# Patient Record
Sex: Male | Born: 2000 | Race: White | Hispanic: No | Marital: Single | Smoking: Never smoker
Health system: Southern US, Community
[De-identification: ages and names within clinical notes are randomized; demographics above are authoritative.]

## PROBLEM LIST (undated history)

## (undated) DIAGNOSIS — J45909 Unspecified asthma, uncomplicated: Secondary | ICD-10-CM

## (undated) HISTORY — PX: JOINT REPLACEMENT: SHX530

---
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
## 2019-04-02 ENCOUNTER — Other Ambulatory Visit: Payer: Self-pay

## 2019-04-02 ENCOUNTER — Ambulatory Visit
Admission: EM | Admit: 2019-04-02 | Discharge: 2019-04-02 | Disposition: A | Discharge status: Home / Self Care | Payer: Medicaid Other | Attending: Emergency Medicine | Admitting: Emergency Medicine

## 2019-04-02 ENCOUNTER — Encounter: Payer: Self-pay | Admitting: *Deleted

## 2019-04-02 ENCOUNTER — Ambulatory Visit (INDEPENDENT_AMBULATORY_CARE_PROVIDER_SITE_OTHER): Payer: Medicaid Other

## 2019-04-02 DIAGNOSIS — R3916 Straining to void: Secondary | ICD-10-CM | POA: Diagnosis not present

## 2019-04-02 DIAGNOSIS — R109 Unspecified abdominal pain: Secondary | ICD-10-CM

## 2019-04-02 DIAGNOSIS — R195 Other fecal abnormalities: Secondary | ICD-10-CM | POA: Diagnosis not present

## 2019-04-02 DIAGNOSIS — K625 Hemorrhage of anus and rectum: Secondary | ICD-10-CM | POA: Diagnosis not present

## 2019-04-02 DIAGNOSIS — R110 Nausea: Secondary | ICD-10-CM | POA: Diagnosis not present

## 2019-04-02 LAB — POCT URINALYSIS DIP (MANUAL ENTRY)
Bilirubin, UA: NEGATIVE
Blood, UA: NEGATIVE
Glucose, UA: NEGATIVE mg/dL
Ketones, POC UA: NEGATIVE mg/dL
Leukocytes, UA: NEGATIVE
Nitrite, UA: NEGATIVE
Protein Ur, POC: NEGATIVE mg/dL
Spec Grav, UA: 1.015 (ref 1.010–1.025)
Urobilinogen, UA: 0.2 E.U./dL
pH, UA: 7.5 (ref 5.0–8.0)

## 2019-04-02 NOTE — ED Provider Notes (Signed)
[facility]   [id number] 04/02/19 Arrival Time: 1333  CC: Rectal bleeding, dark stools, and straining with urination  SUBJECTIVE:  Stephen GlazierJason T Meyer is a 18 y.o. male who presents with complaint of rectal bleeding with wiping that occurred 1 week ago.  Symptoms began after having a normal bowel movement.  Denies straining or hard stool.  Had black tarry stools this morning.  Denies taking pepto, ibuprofen, aleve, or alcohol use.  Denies rectal or abdominal pain.  Has not tried OTC medications.  Denies alleviating or aggravating factors.  Reports similar symptoms in the past with possible stomach ulcer 1 year ago.  Was seen at Urgent Care for that problem, does not have a PCP.  Last BM this morning dark tarry stool.  Complains of fluctuating appetite, nausea, diarrhea, as well as intermittent straining with urination x 1 month.    Denies fever, chills, weight changes, vomiting, chest pain, SOB, constipation, dysuria, difficulty urinating, increased frequency or urgency, flank pain, hematuria, loss of bowel or bladder function, testicular pain/ swelling, penile discharge.   ROS: As per HPI.  All other pertinent ROS negative.     Biological father died from prostate cancer in his 2650's.  Denies hx of inflammatory bowel disease or other cancer in the family.  Patient lives with adoptive parents.    History reviewed. No pertinent past medical history. History reviewed. No pertinent surgical history. No Known Allergies No current facility-administered medications on file prior to encounter.    Current Outpatient Medications on File Prior to Encounter  Medication Sig Dispense Refill  . ALBUTEROL SULFATE HFA IN Inhale 2 puffs into the lungs every 4 (four) hours as needed.     Social History   Socioeconomic History  . Marital status: Single    Spouse name: Not on file  . Number of children: Not on file  . Years of education: Not on file  . Highest education level: Not on file   Occupational History  . Not on file  Social Needs  . Financial resource strain: Not on file  . Food insecurity    Worry: Not on file    Inability: Not on file  . Transportation needs    Medical: Not on file    Non-medical: Not on file  Tobacco Use  . Smoking status: Never Smoker  . Smokeless tobacco: Never Used  Substance and Sexual Activity  . Alcohol use: Yes    Comment: occasionally  . Drug use: Yes  . Sexual activity: Yes    Birth control/protection: Condom  Lifestyle  . Physical activity    Days per week: Not on file    Minutes per session: Not on file  . Stress: Not on file  Relationships  . Social Musicianconnections    Talks on phone: Not on file    Gets together: Not on file    Attends religious service: Not on file    Active member of club or organization: Not on file    Attends meetings of clubs or organizations: Not on file    Relationship status: Not on file  . Intimate partner violence    Fear of current or ex partner: Not on file    Emotionally abused: Not on file    Physically abused: Not on file    Forced sexual activity: Not on file  Other Topics Concern  . Not on file  Social History Narrative  . Not on file   Family History  Adopted: Yes  Problem Relation  Age of Onset  . Drug abuse Mother   . Drug abuse Father   . Prostate cancer Father   . Diabetes Maternal Grandmother   . Diabetes Paternal Grandmother      OBJECTIVE:  Vitals:   04/02/19 1344 04/02/19 1345  BP: (!) 161/82   Pulse: (!) 57   Resp:  16  Temp:  98.1 F (36.7 C)  TempSrc:  Oral  SpO2: 98%     General appearance: Alert; NAD HEENT: NCAT. PERRL, EOMI grossly; Oropharynx clear.  Lungs: clear to auscultation bilaterally without adventitious breath sounds Heart: regular rate and rhythm.  Radial pulses 2+ symmetrical bilaterally Abdomen: soft, non-distended; normal active bowel sounds; mildly TTP over RLQ; negative Murphy's sign; negative rebound; no guarding RECTAL: Declines  Back: no CVA tenderness Extremities: no edema; symmetrical with no gross deformities Skin: warm and dry Neurologic: normal gait Psychological: alert and cooperative; normal mood and affect  LABS: Results for orders placed or performed during the hospital encounter of 04/02/19 (from the past 24 hour(s))  POCT urinalysis dipstick     Status: None   Collection Time: 04/02/19  2:00 PM  Result Value Ref Range   Color, UA yellow yellow   Clarity, UA clear clear   Glucose, UA negative negative mg/dL   Bilirubin, UA negative negative   Ketones, POC UA negative negative mg/dL   Spec Grav, UA 1.015 1.010 - 1.025   Blood, UA negative negative   pH, UA 7.5 5.0 - 8.0   Protein Ur, POC negative negative mg/dL   Urobilinogen, UA 0.2 0.2 or 1.0 E.U./dL   Nitrite, UA Negative Negative   Leukocytes, UA Negative Negative    DIAGNOSTIC STUDIES: Dg Abd 2 Views  Result Date: 04/02/2019 CLINICAL DATA:  Abdominal pain EXAM: ABDOMEN - 2 VIEW COMPARISON:  None. FINDINGS: Nonspecific bowel gas pattern. Mild volume of stool in the colon. No radiodense calculi. No gross free intraperitoneal air. Lung bases clear. Osseous structures unremarkable. IMPRESSION: Nonspecific bowel gas pattern. Electronically Signed   By: Davina Poke M.D.   On: 04/02/2019 14:58    ABD X-rays:  No obvious small or large bowel obstruction or stair-stepping pattern.  No obvious masses.     I have reviewed the x-rays myself and the radiologist interpretation. I am in agreement with the radiologist interpretation.     ASSESSMENT & PLAN:  1. Rectal bleeding   2. Dark stools   3. Straining on urination    Declines rectal exam in office Declines further work-up in the ED for RLQ pain and other symptoms Urine did not show signs of infection Abdominal x-ray showed some stool burden, but no perforations, or blockages Symptoms are concerning you should follow up with primary care as soon as possible.  [facility] can see  you next week.   In the meantime push fluids, drink at least half your body weight in ounces Eat a well-balanced diet of lean meats, fruits, and vegetables Return or go to the ED if you have any new or worsening symptoms such as fever, chills, nausea, vomiting, lightheadedness, dizziness, abdominal pain, other changes in bowel or urinary habits, etc...  Reviewed expectations re: course of current medical issues. Questions answered. Outlined signs and symptoms indicating need for more acute intervention. Patient verbalized understanding. After Visit Summary given.   Lestine Box, PA-C 04/02/19 1537

## 2019-04-02 NOTE — Discharge Instructions (Addendum)
Declines rectal exam in office Declines further work-up in the ED for RLQ pain and other symptoms Urine did not show signs of infection Abdominal x-ray showed some stool burden, but no perforations, or blockages Symptoms are concerning you should follow up with primary care as soon as possible.  [facility] can see you next week.   In the meantime push fluids, drink at least half your body weight in ounces Eat a well-balanced diet of lean meats, fruits, and vegetables Return or go to the ED if you have any new or worsening symptoms such as fever, chills, nausea, vomiting, lightheadedness, dizziness, abdominal pain, other changes in bowel or urinary habits, etc..Marland Kitchen

## 2019-04-02 NOTE — ED Triage Notes (Signed)
C/O noticing red blood in toilet following a BM approx 1 wk ago; states was isolated incident.  This AM noticed black tarry stool.  Also reports intermittent episodes of need to strain to urinate over past month.

## 2019-04-06 ENCOUNTER — Other Ambulatory Visit: Payer: Self-pay

## 2019-04-06 ENCOUNTER — Other Ambulatory Visit: Payer: Medicaid Other

## 2019-04-06 DIAGNOSIS — Z20822 Contact with and (suspected) exposure to covid-19: Secondary | ICD-10-CM

## 2019-04-06 DIAGNOSIS — R6889 Other general symptoms and signs: Secondary | ICD-10-CM | POA: Diagnosis not present

## 2019-04-08 LAB — NOVEL CORONAVIRUS, NAA: SARS-CoV-2, NAA: NOT DETECTED

## 2019-04-09 ENCOUNTER — Ambulatory Visit (INDEPENDENT_AMBULATORY_CARE_PROVIDER_SITE_OTHER): Payer: Medicaid Other | Admitting: Physician Assistant

## 2019-04-09 ENCOUNTER — Other Ambulatory Visit: Payer: Self-pay

## 2019-04-09 ENCOUNTER — Encounter: Payer: Self-pay | Admitting: Physician Assistant

## 2019-04-09 VITALS — BP 122/72 | HR 53 | Temp 98.9°F | Ht 72.0 in | Wt 184.0 lb

## 2019-04-09 DIAGNOSIS — K625 Hemorrhage of anus and rectum: Secondary | ICD-10-CM | POA: Diagnosis not present

## 2019-04-09 DIAGNOSIS — R34 Anuria and oliguria: Secondary | ICD-10-CM | POA: Diagnosis not present

## 2019-04-09 NOTE — Patient Instructions (Signed)
Constipation, Adult Constipation is when a person:  Poops (has a bowel movement) fewer times in a week than normal.  Has a hard time pooping.  Has poop that is dry, hard, or bigger than normal. Follow these instructions at home: Eating and drinking   Eat foods that have a lot of fiber, such as: ? Fresh fruits and vegetables. ? Whole grains. ? Beans.  Eat less of foods that are high in fat, low in fiber, or overly processed, such as: ? [place] fries. ? Hamburgers. ? Cookies. ? Candy. ? Soda.  Drink enough fluid to keep your pee (urine) clear or pale yellow. General instructions  Exercise regularly or as told by your doctor.  Go to the restroom when you feel like you need to poop. Do not hold it in.  Take over-the-counter and prescription medicines only as told by your doctor. These include any fiber supplements.  Do pelvic floor retraining exercises, such as: ? Doing deep breathing while relaxing your lower belly (abdomen). ? Relaxing your pelvic floor while pooping.  Watch your condition for any changes.  Keep all follow-up visits as told by your doctor. This is important. Contact a doctor if:  You have pain that gets worse.  You have a fever.  You have not pooped for 4 days.  You throw up (vomit).  You are not hungry.  You lose weight.  You are bleeding from the anus.  You have thin, pencil-like poop (stool). Get help right away if:  You have a fever, and your symptoms suddenly get worse.  You leak poop or have blood in your poop.  Your belly feels hard or bigger than normal (is bloated).  You have very bad belly pain.  You feel dizzy or you faint. This information is not intended to replace advice given to you by your health care provider. Make sure you discuss any questions you have with your health care provider. Document Released: 02/13/2008 Document Revised: 08/09/2017 Document Reviewed: 02/15/2016 Elsevier Patient Education  2020 [employer] Hemorrhoids Hemorrhoids are swollen veins in and around the rectum or anus. There are two types of hemorrhoids:  Internal hemorrhoids. These occur in the veins that are just inside the rectum. They may poke through to the outside and become irritated and painful.  External hemorrhoids. These occur in the veins that are outside the anus and can be felt as a painful swelling or hard lump near the anus. Most hemorrhoids do not cause serious problems, and they can be managed with home treatments such as diet and lifestyle changes. If home treatments do not help the symptoms, procedures can be done to shrink or remove the hemorrhoids. What are the causes? This condition is caused by increased pressure in the anal area. This pressure may result from various things, including:  Constipation.  Straining to have a bowel movement.  Diarrhea.  Pregnancy.  Obesity.  Sitting for long periods of time.  Heavy lifting or other activity that causes you to strain.  Anal sex.  Riding a bike for a long period of time. What are the signs or symptoms? Symptoms of this condition include:  Pain.  Anal itching or irritation.  Rectal bleeding.  Leakage of stool (feces).  Anal swelling.  One or more lumps around the anus. How is this diagnosed? This condition can often be diagnosed through a visual exam. Other exams or tests may also be done, such as:  An exam that involves feeling the rectal area with a  gloved hand (digital rectal exam).  An exam of the anal canal that is done using a small tube (anoscope).  A blood test, if you have lost a significant amount of blood.  A test to look inside the colon using a flexible tube with a camera on the end (sigmoidoscopy or colonoscopy). How is this treated? This condition can usually be treated at home. However, various procedures may be done if dietary changes, lifestyle changes, and other home treatments do not help your symptoms. These  procedures can help make the hemorrhoids smaller or remove them completely. Some of these procedures involve surgery, and others do not. Common procedures include:  Rubber band ligation. Rubber bands are placed at the base of the hemorrhoids to cut off their blood supply.  Sclerotherapy. Medicine is injected into the hemorrhoids to shrink them.  Infrared coagulation. A type of light energy is used to get rid of the hemorrhoids.  Hemorrhoidectomy surgery. The hemorrhoids are surgically removed, and the veins that supply them are tied off.  Stapled hemorrhoidopexy surgery. The surgeon staples the base of the hemorrhoid to the rectal wall. Follow these instructions at home: Eating and drinking   Eat foods that have a lot of fiber in them, such as whole grains, beans, nuts, fruits, and vegetables.  Ask your health care provider about taking products that have added fiber (fiber supplements).  Reduce the amount of fat in your diet. You can do this by eating low-fat dairy products, eating less red meat, and avoiding processed foods.  Drink enough fluid to keep your urine pale yellow. Managing pain and swelling   Take warm sitz baths for 20 minutes, 3-4 times a day to ease pain and discomfort. You may do this in a bathtub or using a portable sitz bath that fits over the toilet.  If directed, apply ice to the affected area. Using ice packs between sitz baths may be helpful. ? Put ice in a plastic bag. ? Place a towel between your skin and the bag. ? Leave the ice on for 20 minutes, 2-3 times a day. General instructions  Take over-the-counter and prescription medicines only as told by your health care provider.  Use medicated creams or suppositories as told.  Get regular exercise. Ask your health care provider how much and what kind of exercise is best for you. In general, you should do moderate exercise for at least 30 minutes on most days of the week (150 minutes each week). This can  include activities such as walking, biking, or yoga.  Go to the bathroom when you have the urge to have a bowel movement. Do not wait.  Avoid straining to have bowel movements.  Keep the anal area dry and clean. Use wet toilet paper or moist towelettes after a bowel movement.  Do not sit on the toilet for long periods of time. This increases blood pooling and pain.  Keep all follow-up visits as told by your health care provider. This is important. Contact a health care provider if you have:  Increasing pain and swelling that are not controlled by treatment or medicine.  Difficulty having a bowel movement, or you are unable to have a bowel movement.  Pain or inflammation outside the area of the hemorrhoids. Get help right away if you have:  Uncontrolled bleeding from your rectum. Summary  Hemorrhoids are swollen veins in and around the rectum or anus.  Most hemorrhoids can be managed with home treatments such as diet and lifestyle changes.  Taking warm sitz baths can help ease pain and discomfort.  In severe cases, procedures or surgery can be done to shrink or remove the hemorrhoids. This information is not intended to replace advice given to you by your health care provider. Make sure you discuss any questions you have with your health care provider. Document Released: 08/24/2000 Document Revised: 09/04/2018 Document Reviewed: 01/16/2018 Elsevier Patient Education  2020 [employer].

## 2019-04-09 NOTE — Progress Notes (Signed)
  Subjective:     Patient ID: Stephen Meyer, male   DOB: 09/01/2001, 18 y.o.   MRN: [id number]  HPI Pt here for f/u from Urgent Care Recently seen due to rectal bleeding and urinary hesitancy States sx have improved At the time rectal bleeding was just with BM and was scant amt and bright red Had some assoc abd cramping and urinary sx Xray showed some constipation  Review of Systems  Gastrointestinal: Positive for abdominal pain, anal bleeding, blood in stool and constipation. Negative for abdominal distention, diarrhea, nausea, rectal pain and vomiting.  Genitourinary: Positive for decreased urine volume and difficulty urinating. Negative for discharge, dysuria, flank pain, frequency, hematuria, scrotal swelling, testicular pain and urgency.       Objective:   Physical Exam Vitals signs (BP improved today) and nursing note reviewed.  Constitutional:      Appearance: Normal appearance. He is normal weight. He is not ill-appearing.  Abdominal:     General: Abdomen is flat. Bowel sounds are normal. There is no distension.     Palpations: Abdomen is soft. There is no mass.     Tenderness: There is no abdominal tenderness. There is no guarding or rebound.  Neurological:     Mental Status: He is alert.  Psychiatric:        Mood and Affect: Mood normal.        Behavior: Behavior normal.        Thought Content: Thought content normal.        Judgment: Judgment normal.        Assessment:     1. Urine production scanty   2. Rectal bleeding        Plan:     Discussed rectal sx seem SE of constipation/hemorrhoids Importance of fiber and fluids discussed Pt is also sexual active and not using condoms at all times Due to urinary sx urine GC/Chla done today and will inform of results Safe sex practices reviewed OTC meds for hemorrhoids if sx reoccur F/U pending labs

## 2019-04-14 DIAGNOSIS — Z029 Encounter for administrative examinations, unspecified: Secondary | ICD-10-CM

## 2019-04-16 LAB — CHLAMYDIA/GONOCOCCUS/TRICHOMONAS, NAA
Chlamydia by NAA: NEGATIVE
Gonococcus by NAA: NEGATIVE
Trich vag by NAA: NEGATIVE

## 2019-04-21 ENCOUNTER — Telehealth: Payer: Self-pay | Admitting: Family Medicine

## 2019-04-22 NOTE — Telephone Encounter (Signed)
Pt aware FMLA ppw faxed on 04/20/19

## 2019-05-25 ENCOUNTER — Encounter: Payer: Self-pay | Admitting: *Deleted

## 2019-10-05 ENCOUNTER — Other Ambulatory Visit: Payer: Self-pay

## 2019-10-05 ENCOUNTER — Encounter: Payer: Self-pay | Admitting: Emergency Medicine

## 2019-10-05 ENCOUNTER — Ambulatory Visit
Admission: EM | Admit: 2019-10-05 | Discharge: 2019-10-05 | Disposition: A | Discharge status: Home / Self Care | Payer: Medicaid Other | Attending: Emergency Medicine | Admitting: Emergency Medicine

## 2019-10-05 DIAGNOSIS — B070 Plantar wart: Secondary | ICD-10-CM

## 2019-10-05 MED ORDER — SALICYLIC ACID 27.5 % EX LIQD: 1.0000 | Freq: Two times a day (BID) | CUTANEOUS | 0 refills | Status: DC

## 2019-10-05 NOTE — Discharge Instructions (Addendum)
Apply medication as prescribed Soak foot in warm water Remove the top layer of skin before applying medicine. May use  pumice stone to remove the skin Follow-up with primary care for referral to a podiatry

## 2019-10-05 NOTE — ED Provider Notes (Signed)
[facility]    CSN: [id number] Arrival date & time: 10/05/19  1140      History   Chief Complaint Chief Complaint  Patient presents with  . Foot Pain    HPI Stephen Meyer is a 19 y.o. male.   Stephen Meyer 34 y old male with history of wart complains of bilateral  plantar wart on bilateral distal dorsal feet and great toe for the past 2 to 3 weeks.  Reports history of foot wart.  He localizes the pain to the sole of both feet.  He describes the pain as constant and achy.  He has not tried any medication.  His symptoms are made worse with ROM.  He denies similar symptoms in the past that improved after visiting a podiatrist       History reviewed. No pertinent past medical history.  Patient Active Problem List   Diagnosis Date Noted  . 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 Obesity, unspecified 01/21/2013  . Exercise-induced asthma 01/21/2013  . Foster care (status) 01/21/2013    Past Surgical History:  Procedure Laterality Date  . JOINT REPLACEMENT     ankle sugery       Home Medications    Prior to Admission medications   Medication Sig Start Date End Date Taking? Authorizing Provider  Salicylic Acid 40.9 % LIQD Apply 1 each topically 2 (two) times daily. 10/05/19 11/04/19  AvegnoDarrelyn Hillock, FNP    Family History Family History  Adopted: Yes  Problem Relation Age of Onset  . Prostate cancer Father   . Cancer Father   . Diabetes Maternal Grandmother   . Diabetes Paternal Grandmother     Social History Social History   Tobacco Use  . Smoking status: Never Smoker  . Smokeless tobacco: Never Used  Substance Use Topics  . Alcohol use: Yes    Comment: occasionally  . Drug use: Yes    Types: Marijuana     Allergies   Patient has no known allergies.   Review of Systems Review of Systems  Constitutional: Negative.   Respiratory: Negative.   Cardiovascular: Negative.   Skin:       Plantar wart  All other systems reviewed and are  negative.    Physical Exam Triage Vital Signs ED Triage Vitals  Enc Vitals Group     BP      Pulse      Resp      Temp      Temp src      SpO2      Weight      Height      Head Circumference      Peak Flow      Pain Score      Pain Loc      Pain Edu?      Excl. in [place]?    No data found.  Updated Vital Signs BP 126/70 (BP Location: Right Arm)   Pulse (!) 53   Temp 98.2 F (36.8 C) (Oral)   Resp 16   SpO2 98%   Visual Acuity Right Eye Distance:   Left Eye Distance:   Bilateral Distance:    Right Eye Near:   Left Eye Near:    Bilateral Near:     Physical Exam Vitals and nursing note reviewed.  Constitutional:      General: He is not in acute distress.    Appearance: Normal appearance. He is normal weight. He is not ill-appearing or toxic-appearing.  Cardiovascular:     Rate and Rhythm: Normal rate and regular rhythm.     Pulses: Normal pulses.     Heart sounds: No murmur.  Pulmonary:     Effort: Pulmonary effort is normal. No respiratory distress.     Breath sounds: Normal breath sounds. No stridor. No wheezing, rhonchi or rales.  Chest:     Chest wall: No tenderness.  Skin:    General: Skin is warm.     Coloration: Skin is not jaundiced.     Findings: No abrasion, abscess, bruising, erythema, signs of injury or lesion. Rash is not crusting, nodular, purpuric or pustular.     Comments: Plantar wart present both feet  Neurological:     Mental Status: He is alert.      UC Treatments / Results  Labs (all labs ordered are listed, but only abnormal results are displayed) Labs Reviewed - No data to display  EKG   Radiology No results found.  Procedures Procedures (including critical care time)  Medications Ordered in UC Medications - No data to display  Initial Impression / Assessment and Plan / UC Course  I have reviewed the triage vital signs and the nursing notes.  Pertinent labs & imaging results that were available during my care of  the patient were reviewed by me and considered in my medical decision making (see chart for details).   Advised patient to follow-up with primary care for referral to podiatry Acid salicylic was prescribed Advised patient to return for worsening symptom  Final Clinical Impressions(s) / UC Diagnoses   Final diagnoses:  Plantar wart of both feet     Discharge Instructions     Apply medication as prescribed Soak foot in warm water Remove the top layer of soft and skin before applying medicine for can use pumice stone to remove the skin Follow-up with primary care for referral to a podiatry    ED Prescriptions    Medication Sig Dispense Auth. Provider   Salicylic Acid 27.5 % LIQD Apply 1 each topically 2 (two) times daily. 20 mL Terrilee Dudzik, Zachery Dakins, FNP     PDMP not reviewed this encounter.   Durward Parcel, FNP 10/05/19 1224

## 2019-10-05 NOTE — ED Triage Notes (Signed)
Pt concerned that he has warts on soles of both feet

## 2019-10-08 ENCOUNTER — Ambulatory Visit
Admission: EM | Admit: 2019-10-08 | Discharge: 2019-10-08 | Disposition: A | Discharge status: Home / Self Care | Payer: Medicaid Other | Attending: Emergency Medicine | Admitting: Emergency Medicine

## 2019-10-08 ENCOUNTER — Other Ambulatory Visit: Payer: Self-pay

## 2019-10-08 DIAGNOSIS — Z711 Person with feared health complaint in whom no diagnosis is made: Secondary | ICD-10-CM | POA: Diagnosis not present

## 2019-10-08 DIAGNOSIS — Z20822 Contact with and (suspected) exposure to covid-19: Secondary | ICD-10-CM

## 2019-10-08 NOTE — Discharge Instructions (Addendum)
COVID testing ordered.  It will take between 2-5 days for test results.  Someone will contact you regarding abnormal results.    In the meantime: You should remain isolated in your home for 10 days from symptom onset AND greater than 72 hours after symptoms resolution (absence of fever without the use of fever-reducing medication and improvement in respiratory symptoms), whichever is longer Get plenty of rest and push fluids Use OTC zyrtec for nasal congestion, runny nose, and/or sore throat Use OTC flonase for nasal congestion and runny nose Use medications daily for symptom relief Use OTC medications like ibuprofen or tylenol as needed fever or pain Call or go to the ED if you have any new or worsening symptoms such as fever, cough, shortness of breath, chest tightness, chest pain, turning blue, changes in mental status, etc...  

## 2019-10-08 NOTE — ED Provider Notes (Signed)
[facility]   [id number] 10/08/19 Arrival Time: 0254   CC: Loss of taste  SUBJECTIVE: History from: patient.  Stephen Meyer is a 19 y.o. male who presents with loss of taste that occurred briefly yesterday, that later resolved.  Denies sick exposure to COVID, flu or strep.  Denies recent travel.  Denies aggravating or alleviating factors.  Denies previous COVID infection.   Denies fever, chills, fatigue, nasal congestion, rhinorrhea, sore throat, cough, SOB, wheezing, chest pain, nausea, vomiting, changes in bowel or bladder habits.     ROS: As per HPI.  All other pertinent ROS negative.     History reviewed. No pertinent past medical history. Past Surgical History:  Procedure Laterality Date  . JOINT REPLACEMENT     ankle sugery   No Known Allergies No current facility-administered medications on file prior to encounter.   Current Outpatient Medications on File Prior to Encounter  Medication Sig Dispense Refill  . Salicylic Acid 27.0 % LIQD Apply 1 each topically 2 (two) times daily. 20 mL 0   Social History   Socioeconomic History  . Marital status: Single    Spouse name: Not on file  . Number of children: Not on file  . Years of education: Not on file  . Highest education level: Not on file  Occupational History  . Not on file  Tobacco Use  . Smoking status: Never Smoker  . Smokeless tobacco: Never Used  Substance and Sexual Activity  . Alcohol use: Yes    Comment: occasionally  . Drug use: Yes    Types: Marijuana  . Sexual activity: Yes    Birth control/protection: Condom  Other Topics Concern  . Not on file  Social History Narrative  . Not on file   Social Determinants of Health   Financial Resource Strain:   . Difficulty of Paying Living Expenses: Not on file  Food Insecurity:   . Worried About Charity fundraiser in the Last Year: Not on file  . Ran Out of Food in the Last Year: Not on file  Transportation Needs:   . Lack of  Transportation (Medical): Not on file  . Lack of Transportation (Non-Medical): Not on file  Physical Activity:   . Days of Exercise per Week: Not on file  . Minutes of Exercise per Session: Not on file  Stress:   . Feeling of Stress : Not on file  Social Connections:   . Frequency of Communication with Friends and Family: Not on file  . Frequency of Social Gatherings with Friends and Family: Not on file  . Attends Religious Services: Not on file  . Active Member of Clubs or Organizations: Not on file  . Attends Archivist Meetings: Not on file  . Marital Status: Not on file  Intimate Partner Violence:   . Fear of Current or Ex-Partner: Not on file  . Emotionally Abused: Not on file  . Physically Abused: Not on file  . Sexually Abused: Not on file   Family History  Adopted: Yes  Problem Relation Age of Onset  . Prostate cancer Father   . Cancer Father   . Diabetes Maternal Grandmother   . Diabetes Paternal Grandmother     OBJECTIVE:  Vitals:   10/08/19 1352  BP: 135/75  Pulse: 61  Resp: 18  Temp: 98.7 F (37.1 C)  SpO2: 98%    General appearance: alert; well-appearing, nontoxic; speaking in full sentences and tolerating own secretions HEENT: NCAT; Ears: EACs  clear, TMs pearly gray; Eyes: PERRL.  EOM grossly intact. Nose: nares patent without rhinorrhea, Throat: oropharynx clear, tonsils non erythematous or enlarged, uvula midline  Neck: supple without LAD Lungs: unlabored respirations, symmetrical air entry; cough: absent; no respiratory distress; CTAB Heart: regular rate and rhythm.  Skin: warm and dry Psychological: alert and cooperative; normal mood and affect  ASSESSMENT & PLAN:  1. Encounter for laboratory testing for COVID-19 virus   2. Worried well     COVID testing ordered.  It will take between 2-5 days for test results.  Someone will contact you regarding abnormal results.    In the meantime: You should remain isolated in your home for 10  days from symptom onset AND greater than 72 hours after symptoms resolution (absence of fever without the use of fever-reducing medication and improvement in respiratory symptoms), whichever is longer Get plenty of rest and push fluids Use OTC zyrtec for nasal congestion, runny nose, and/or sore throat Use OTC flonase for nasal congestion and runny nose Use medications daily for symptom relief Use OTC medications like ibuprofen or tylenol as needed fever or pain Call or go to the ED if you have any new or worsening symptoms such as fever, cough, shortness of breath, chest tightness, chest pain, turning blue, changes in mental status, etc...   Reviewed expectations re: course of current medical issues. Questions answered. Outlined signs and symptoms indicating need for more acute intervention. Patient verbalized understanding. After Visit Summary given.         Rennis Harding, PA-C 10/08/19 1403

## 2019-10-08 NOTE — ED Triage Notes (Signed)
Pt needs covid test for work  

## 2019-10-09 LAB — NOVEL CORONAVIRUS, NAA: SARS-CoV-2, NAA: NOT DETECTED

## 2019-10-14 ENCOUNTER — Ambulatory Visit
Admission: EM | Admit: 2019-10-14 | Discharge: 2019-10-14 | Disposition: A | Discharge status: Home / Self Care | Payer: Medicaid Other

## 2019-10-14 ENCOUNTER — Other Ambulatory Visit: Payer: Self-pay

## 2019-10-27 ENCOUNTER — Ambulatory Visit (INDEPENDENT_AMBULATORY_CARE_PROVIDER_SITE_OTHER): Payer: Medicaid Other | Admitting: Family Medicine

## 2019-10-27 ENCOUNTER — Other Ambulatory Visit: Payer: Self-pay

## 2019-10-27 ENCOUNTER — Encounter: Payer: Self-pay | Admitting: Family Medicine

## 2019-10-27 VITALS — BP 121/71 | HR 65 | Temp 99.0°F | Resp 18 | Ht 72.06 in | Wt 181.0 lb

## 2019-10-27 DIAGNOSIS — R55 Syncope and collapse: Secondary | ICD-10-CM | POA: Diagnosis not present

## 2019-10-27 DIAGNOSIS — B078 Other viral warts: Secondary | ICD-10-CM

## 2019-10-27 DIAGNOSIS — R002 Palpitations: Secondary | ICD-10-CM | POA: Diagnosis not present

## 2019-10-27 DIAGNOSIS — R350 Frequency of micturition: Secondary | ICD-10-CM

## 2019-10-27 DIAGNOSIS — B079 Viral wart, unspecified: Secondary | ICD-10-CM

## 2019-10-27 LAB — BAYER DCA HB A1C WAIVED: HB A1C (BAYER DCA - WAIVED): 4.8 % (ref <7.0)

## 2019-10-27 LAB — GLUCOSE HEMOCUE WAIVED: Glu Hemocue Waived: 99 mg/dL (ref 65–99)

## 2019-10-27 NOTE — Progress Notes (Signed)
Subjective:  Patient ID: Stephen Meyer, male    DOB: 02-Oct-2000, 19 y.o.   MRN: [id number]  Patient Care Team: Baruch Gouty, FNP as PCP - General (Family Medicine)   Chief Complaint:  Fatigue (episodes of black out/ pass out )   HPI: Stephen Meyer is a 19 y.o. male presenting on 10/27/2019 for Fatigue (episodes of black out/ pass out )   Pt presents today for evaluation of polyuria, near syncope, palpitations, and syncope. Pt states this has been going on for over 2 years. States he has passed out a few times. States he feels hot and diaphoretic prior to passing out. States this happens at random and nothing seems to make this worse or better. No chest pain or shortness of breath. No focal deficits. No loss of bowel or bladder. No seizure like activity. Does report voiding more frequently but no dysuria or hematuria. No polydipsia or polyphagia. No weight changes. No hair loss or skin changes.      Relevant past medical, surgical, family, and social history reviewed and updated as indicated.  Allergies and medications reviewed and updated. Date reviewed: Chart in Epic.   History reviewed. No pertinent past medical history.  Past Surgical History:  Procedure Laterality Date  . JOINT REPLACEMENT     ankle sugery    Social History   Socioeconomic History  . Marital status: Single    Spouse name: Not on file  . Number of children: Not on file  . Years of education: Not on file  . Highest education level: Not on file  Occupational History  . Not on file  Tobacco Use  . Smoking status: Never Smoker  . Smokeless tobacco: Never Used  Substance and Sexual Activity  . Alcohol use: Yes    Comment: occasionally  . Drug use: Yes    Types: Marijuana  . Sexual activity: Yes    Birth control/protection: Condom  Other Topics Concern  . Not on file  Social History Narrative  . Not on file   Social Determinants of Health   Financial Resource Strain:   . Difficulty of  Paying Living Expenses: Not on file  Food Insecurity:   . Worried About Charity fundraiser in the Last Year: Not on file  . Ran Out of Food in the Last Year: Not on file  Transportation Needs:   . Lack of Transportation (Medical): Not on file  . Lack of Transportation (Non-Medical): Not on file  Physical Activity:   . Days of Exercise per Week: Not on file  . Minutes of Exercise per Session: Not on file  Stress:   . Feeling of Stress : Not on file  Social Connections:   . Frequency of Communication with Friends and Family: Not on file  . Frequency of Social Gatherings with Friends and Family: Not on file  . Attends Religious Services: Not on file  . Active Member of Clubs or Organizations: Not on file  . Attends Archivist Meetings: Not on file  . Marital Status: Not on file  Intimate Partner Violence:   . Fear of Current or Ex-Partner: Not on file  . Emotionally Abused: Not on file  . Physically Abused: Not on file  . Sexually Abused: Not on file    Outpatient Encounter Medications as of 10/27/2019  Medication Sig  . [DISCONTINUED] Salicylic Acid 55.7 % LIQD Apply 1 each topically 2 (two) times daily.   No facility-administered encounter medications  on file as of 10/27/2019.    No Known Allergies  Review of Systems  Constitutional: Negative for activity change, appetite change, chills, diaphoresis, fatigue, fever and unexpected weight change.  HENT: Negative.   Eyes: Negative.  Negative for photophobia and visual disturbance.  Respiratory: Negative for cough, chest tightness and shortness of breath.   Cardiovascular: Positive for palpitations. Negative for chest pain and leg swelling.  Gastrointestinal: Negative for abdominal pain, anal bleeding, blood in stool, constipation, diarrhea, nausea and vomiting.  Endocrine: Positive for polyuria. Negative for cold intolerance, heat intolerance, polydipsia and polyphagia.  Genitourinary: Negative for decreased urine  volume, difficulty urinating, dysuria, frequency and urgency.  Musculoskeletal: Negative for arthralgias and myalgias.  Skin:       Lesion to right great toe  Allergic/Immunologic: Negative.   Neurological: Positive for dizziness, syncope and light-headedness. Negative for tremors, seizures, facial asymmetry, speech difficulty, weakness, numbness and headaches.  Hematological: Negative.  Negative for adenopathy. Does not bruise/bleed easily.  Psychiatric/Behavioral: Negative for confusion, hallucinations, sleep disturbance and suicidal ideas.  All other systems reviewed and are negative.       Objective:  BP 121/71   Pulse 65   Temp 99 F (37.2 C)   Resp 18   Ht 6' 0.06" (1.83 m)   Wt 181 lb (82.1 kg)   SpO2 99%   BMI 24.51 kg/m    Wt Readings from Last 3 Encounters:  10/27/19 181 lb (82.1 kg) (82 %, Z= 0.91)*  04/09/19 184 lb (83.5 kg) (86 %, Z= 1.06)*  11/21/16 180 lb (81.6 kg) (91 %, Z= 1.33)*   * Growth percentiles are based on CDC (Boys, 2-20 Years) data.    Physical Exam Vitals and nursing note reviewed.  Constitutional:      General: He is not in acute distress.    Appearance: Normal appearance. He is well-developed and well-groomed. He is not ill-appearing, toxic-appearing or diaphoretic.  HENT:     Head: Normocephalic and atraumatic.     Jaw: There is normal jaw occlusion.     Right Ear: Hearing normal.     Left Ear: Hearing normal.     Nose: Nose normal.     Mouth/Throat:     Lips: Pink.     Mouth: Mucous membranes are moist.     Pharynx: Oropharynx is clear. Uvula midline.  Eyes:     General: Lids are normal.     Extraocular Movements: Extraocular movements intact.     Conjunctiva/sclera: Conjunctivae normal.     Pupils: Pupils are equal, round, and reactive to light.  Neck:     Thyroid: No thyroid mass, thyromegaly or thyroid tenderness.     Vascular: No carotid bruit or JVD.     Trachea: Trachea and phonation normal.  Cardiovascular:     Rate and  Rhythm: Regular rhythm. Bradycardia present.  No extrasystoles are present.    Chest Wall: PMI is not displaced.     Pulses: Normal pulses.     Heart sounds: Normal heart sounds. No murmur. No friction rub. No gallop. No S3 sounds.   Pulmonary:     Effort: Pulmonary effort is normal. No respiratory distress.     Breath sounds: Normal breath sounds. No wheezing.  Abdominal:     General: Bowel sounds are normal. There is no distension or abdominal bruit.     Palpations: Abdomen is soft. There is no hepatomegaly or splenomegaly.     Tenderness: There is no abdominal tenderness. There is no right  CVA tenderness or left CVA tenderness.     Hernia: No hernia is present.  Musculoskeletal:        General: Normal range of motion.     Cervical back: Normal range of motion and neck supple.     Right lower leg: No edema.     Left lower leg: No edema.  Lymphadenopathy:     Cervical: No cervical adenopathy.  Skin:    General: Skin is warm and dry.     Capillary Refill: Capillary refill takes less than 2 seconds.     Coloration: Skin is not cyanotic, jaundiced or pale.     Findings: Lesion present. No rash.     Comments: Hyperkeratotic lesion to right medial great toe  Neurological:     General: No focal deficit present.     Mental Status: He is alert and oriented to person, place, and time.     Cranial Nerves: Cranial nerves are intact. No cranial nerve deficit.     Sensory: Sensation is intact. No sensory deficit.     Motor: Motor function is intact. No weakness.     Coordination: Coordination is intact. Coordination normal.     Gait: Gait is intact. Gait normal.     Deep Tendon Reflexes: Reflexes are normal and symmetric. Reflexes normal.  Psychiatric:        Attention and Perception: Attention and perception normal.        Mood and Affect: Mood and affect normal.        Speech: Speech normal.        Behavior: Behavior normal. Behavior is cooperative.        Thought Content: Thought  content normal.        Cognition and Memory: Cognition and memory normal.        Judgment: Judgment normal.     Results for orders placed or performed during the hospital encounter of 10/08/19  Novel Coronavirus, NAA ([facility])   Specimen: Nasopharyngeal Swab; Nasopharyngeal(NP) swabs in vial transport medium   NASOPHARYNGE  Result Value Ref Range   SARS-CoV-2, NAA Not Detected Not Detected     Finger stick blood sugar 99 in office with A1C of 4.8. EKG: SB without ectopy or ST-T changes. No previous EKG for comparison. Monia Pouch, FNP-C  Cryo therapy to right great toe verruca vulgaris, three freeze thaw cycles completed. Tolerated well. Bandage applied after procedure completed.   Pertinent labs & imaging results that were available during my care of the patient were reviewed by me and considered in my medical decision making.  Assessment & Plan:  Tildon was seen today for fatigue.  Diagnoses and all orders for this visit:  Increased urinary frequency Finger stick 99 in office with A1C of 4.8. discussed potential causes in detail. Pt aware to eat small frequent meals daily and to keep a log of when symptoms occur.  -     Glucose Hemocue Waived -     Bayer DCA Hb A1c Waived -     CMP14+EGFR -     CBC with Differential/Platelet -     Thyroid Panel With TSH  Syncope, unspecified syncope type Palpitations EKG without acute changes in office. No chest pain or shortness of breath. Pt aware to keep a log of symptoms and surrounding factors. Labs pending. Will check for anemia, thyroid dysfunction, and electrolyte imbalances. Will refer to cardiology if symptoms persist.  -     Glucose Hemocue Waived -     Bayer [facility]  Hb A1c Waived -     CMP14+EGFR -     CBC with Differential/Platelet -     Thyroid Panel With TSH -     EKG 12-Lead  Verruca vulgaris Cryo therapy in office. Will return in 4 weeks for repeat therapy. Aftercare discussed in detail.     Continue all other  maintenance medications.  Follow up plan: Return in about 4 weeks (around 11/24/2019), or if symptoms worsen or fail to improve, for repeat Cryo.  Continue healthy lifestyle choices, including diet (rich in fruits, vegetables, and lean proteins, and low in salt and simple carbohydrates) and exercise (at least 30 minutes of moderate physical activity daily).  Educational handout given for cryo after care   The above assessment and management plan was discussed with the patient. The patient verbalized understanding of and has agreed to the management plan. Patient is aware to call the clinic if they develop any new symptoms or if symptoms persist or worsen. Patient is aware when to return to the clinic for a follow-up visit. Patient educated on when it is appropriate to go to the emergency department.   Monia Pouch, FNP-C [facility] [phone]

## 2019-10-27 NOTE — Progress Notes (Deleted)
Subjective:  Patient ID: Stephen Meyer, male    DOB: 2000/12/28, 19 y.o.   MRN: [id number]  Patient Care Team: Baruch Gouty, FNP as PCP - General (Family Medicine)   Chief Complaint:  Fatigue (episodes of black out/ pass out )   HPI: Stephen Meyer is a 19 y.o. male presenting on 10/27/2019 for Fatigue (episodes of black out/ pass out )   HPI 1. Increased urinary frequency ***  2. Syncope, unspecified syncope type ***  3. Palpitations ***  4. Verruca vulgaris ***     Relevant past medical, surgical, family, and social history reviewed and updated as indicated.  Allergies and medications reviewed and updated. Date reviewed: Chart in Epic.   History reviewed. No pertinent past medical history.  Past Surgical History:  Procedure Laterality Date  . JOINT REPLACEMENT     ankle sugery    Social History   Socioeconomic History  . Marital status: Single    Spouse name: Not on file  . Number of children: Not on file  . Years of education: Not on file  . Highest education level: Not on file  Occupational History  . Not on file  Tobacco Use  . Smoking status: Never Smoker  . Smokeless tobacco: Never Used  Substance and Sexual Activity  . Alcohol use: Yes    Comment: occasionally  . Drug use: Yes    Types: Marijuana  . Sexual activity: Yes    Birth control/protection: Condom  Other Topics Concern  . Not on file  Social History Narrative  . Not on file   Social Determinants of Health   Financial Resource Strain:   . Difficulty of Paying Living Expenses: Not on file  Food Insecurity:   . Worried About Charity fundraiser in the Last Year: Not on file  . Ran Out of Food in the Last Year: Not on file  Transportation Needs:   . Lack of Transportation (Medical): Not on file  . Lack of Transportation (Non-Medical): Not on file  Physical Activity:   . Days of Exercise per Week: Not on file  . Minutes of Exercise per Session: Not on file  Stress:     . Feeling of Stress : Not on file  Social Connections:   . Frequency of Communication with Friends and Family: Not on file  . Frequency of Social Gatherings with Friends and Family: Not on file  . Attends Religious Services: Not on file  . Active Member of Clubs or Organizations: Not on file  . Attends Archivist Meetings: Not on file  . Marital Status: Not on file  Intimate Partner Violence:   . Fear of Current or Ex-Partner: Not on file  . Emotionally Abused: Not on file  . Physically Abused: Not on file  . Sexually Abused: Not on file    Outpatient Encounter Medications as of 10/27/2019  Medication Sig  . [DISCONTINUED] Salicylic Acid 19.4 % LIQD Apply 1 each topically 2 (two) times daily.   No facility-administered encounter medications on file as of 10/27/2019.    No Known Allergies  Review of Systems      Objective:  BP 121/71   Pulse 65   Temp 99 F (37.2 C)   Resp 18   Ht 6' 0.06" (1.83 m)   Wt 181 lb (82.1 kg)   SpO2 99%   BMI 24.51 kg/m    Wt Readings from Last 3 Encounters:  10/27/19 181 lb (82.1  kg) (82 %, Z= 0.91)*  04/09/19 184 lb (83.5 kg) (86 %, Z= 1.06)*  11/21/16 180 lb (81.6 kg) (91 %, Z= 1.33)*   * Growth percentiles are based on CDC (Boys, 2-20 Years) data.    Physical Exam  Results for orders placed or performed during the hospital encounter of 10/08/19  Novel Coronavirus, NAA ([facility])   Specimen: Nasopharyngeal Swab; Nasopharyngeal(NP) swabs in vial transport medium   NASOPHARYNGE  Result Value Ref Range   SARS-CoV-2, NAA Not Detected Not Detected     Finger stick blood sugar 99 in office with A1C of 4.8. EKG: SB without ectopy or ST-T changes. No previous EKG for comparison. Monia Pouch, FNP-C  Pertinent labs & imaging results that were available during my care of the patient were reviewed by me and considered in my medical decision making.  Assessment & Plan:  Stephen Meyer was seen today for fatigue.  Diagnoses and all  orders for this visit:  Increased urinary frequency -     Glucose Hemocue Waived -     Bayer DCA Hb A1c Waived -     CMP14+EGFR -     CBC with Differential/Platelet -     Thyroid Panel With TSH  Syncope, unspecified syncope type -     Glucose Hemocue Waived -     Bayer DCA Hb A1c Waived -     CMP14+EGFR -     CBC with Differential/Platelet -     Thyroid Panel With TSH -     EKG 12-Lead  Palpitations -     CBC with Differential/Platelet -     Thyroid Panel With TSH -     EKG 12-Lead  Verruca vulgaris     Continue all other maintenance medications.  Follow up plan: Return in about 4 weeks (around 11/24/2019), or if symptoms worsen or fail to improve, for repeat Cryo.  Continue healthy lifestyle choices, including diet (rich in fruits, vegetables, and lean proteins, and low in salt and simple carbohydrates) and exercise (at least 30 minutes of moderate physical activity daily).  Educational handout given for ***  The above assessment and management plan was discussed with the patient. The patient verbalized understanding of and has agreed to the management plan. Patient is aware to call the clinic if they develop any new symptoms or if symptoms persist or worsen. Patient is aware when to return to the clinic for a follow-up visit. Patient educated on when it is appropriate to go to the emergency department.   Monia Pouch, FNP-C [facility] [phone]

## 2019-10-27 NOTE — Patient Instructions (Signed)
Cryoablation, Care After This sheet gives you information about how to care for yourself after your procedure. Your health care provider may also give you more specific instructions. If you have problems or questions, contact your health care provider. What can I expect after the procedure? After the procedure, it is common to have:  Soreness around the treatment area.  Mild pain and swelling in the treatment area. Follow these instructions at home: Treatment area care   Follow instructions from your health care provider about how to take care of your incision. Make sure you: ? Wash your hands with soap and water before you change your bandage (dressing). If soap and water are not available, use hand sanitizer. ? Change your dressing as told by your health care provider. ? Leave stitches (sutures) in place. They may need to stay in place for 2 weeks or longer.  Check your treatment area every day for signs of infection. Check for: ? More redness, swelling, or pain. ? More fluid or blood. ? Warmth. ? Pus or a bad smell.  Keep the treated area clean, dry, and covered with a dressing until it has healed. Clean the area with soap and water or as told by your health care provider.  You may shower if your health care provider approves. If your bandage gets wet, change it right away. Activity  Follow instructions from your health care provider about any activity limitations.  Do not drive for 24 hours if you received a medicine to help you relax (sedative). General instructions  Take over-the-counter and prescription medicines only as told by your health care provider.  Keep all follow-up visits as told by your health care provider. This is important. Contact a health care provider if:  You do not have a bowel movement for 2 days.  You have nausea or vomiting.  You have more redness, swelling, or pain around your treatment area.  You have more fluid or blood coming from your  treatment area.  Your treatment area feels warm to the touch.  You have pus or a bad smell coming from your treatment area.  You have a fever. Get help right away if:  You have severe pain.  You have trouble swallowing or breathing.  You have severe weakness or dizziness.  You have chest pain or shortness of breath. This information is not intended to replace advice given to you by your health care provider. Make sure you discuss any questions you have with your health care provider. Document Revised: 08/09/2017 Document Reviewed: 01/25/2016 Elsevier Patient Education  2020 Elsevier Inc.  

## 2019-10-28 LAB — CMP14+EGFR
ALT: 10 IU/L (ref 0–44)
AST: 17 IU/L (ref 0–40)
Albumin/Globulin Ratio: 2.3 — ABNORMAL HIGH (ref 1.2–2.2)
Albumin: 4.9 g/dL (ref 4.1–5.2)
Alkaline Phosphatase: 65 IU/L (ref 39–117)
BUN/Creatinine Ratio: 12 (ref 9–20)
BUN: 11 mg/dL (ref 6–20)
Bilirubin Total: 0.7 mg/dL (ref 0.0–1.2)
CO2: 25 mmol/L (ref 20–29)
Calcium: 9.8 mg/dL (ref 8.7–10.2)
Chloride: 100 mmol/L (ref 96–106)
Creatinine, Ser: 0.95 mg/dL (ref 0.76–1.27)
GFR calc Af Amer: 134 mL/min/{1.73_m2} (ref >59)
GFR calc non Af Amer: 116 mL/min/{1.73_m2} (ref >59)
Globulin, Total: 2.1 g/dL (ref 1.5–4.5)
Glucose: 87 mg/dL (ref 65–99)
Potassium: 4.3 mmol/L (ref 3.5–5.2)
Sodium: 139 mmol/L (ref 134–144)
Total Protein: 7 g/dL (ref 6.0–8.5)

## 2019-10-28 LAB — CBC WITH DIFFERENTIAL/PLATELET
Basophils Absolute: 0.1 10*3/uL (ref 0.0–0.2)
Basos: 1 %
EOS (ABSOLUTE): 0.2 10*3/uL (ref 0.0–0.4)
Eos: 3 %
Hematocrit: 44.6 % (ref 37.5–51.0)
Hemoglobin: 15.6 g/dL (ref 13.0–17.7)
Immature Grans (Abs): 0 10*3/uL (ref 0.0–0.1)
Immature Granulocytes: 0 %
Lymphocytes Absolute: 2.1 10*3/uL (ref 0.7–3.1)
Lymphs: 37 %
MCH: 31.7 pg (ref 26.6–33.0)
MCHC: 35 g/dL (ref 31.5–35.7)
MCV: 91 fL (ref 79–97)
Monocytes Absolute: 0.6 10*3/uL (ref 0.1–0.9)
Monocytes: 10 %
Neutrophils Absolute: 2.8 10*3/uL (ref 1.4–7.0)
Neutrophils: 49 %
Platelets: 221 10*3/uL (ref 150–450)
RBC: 4.92 x10E6/uL (ref 4.14–5.80)
RDW: 12.3 % (ref 11.6–15.4)
WBC: 5.7 10*3/uL (ref 3.4–10.8)

## 2019-10-28 LAB — THYROID PANEL WITH TSH
Free Thyroxine Index: 2.6 (ref 1.2–4.9)
T3 Uptake Ratio: 28 % (ref 24–39)
T4, Total: 9.2 ug/dL (ref 4.5–12.0)
TSH: 4.09 u[IU]/mL (ref 0.450–4.500)

## 2019-11-01 IMAGING — DX ABDOMEN - 2 VIEW
2 series · 2 of 2 positions shown · non-contrast
Comparison: None.

CLINICAL DATA: Abdominal pain

EXAM:
ABDOMEN - 2 VIEW

[abdomen standing ap]
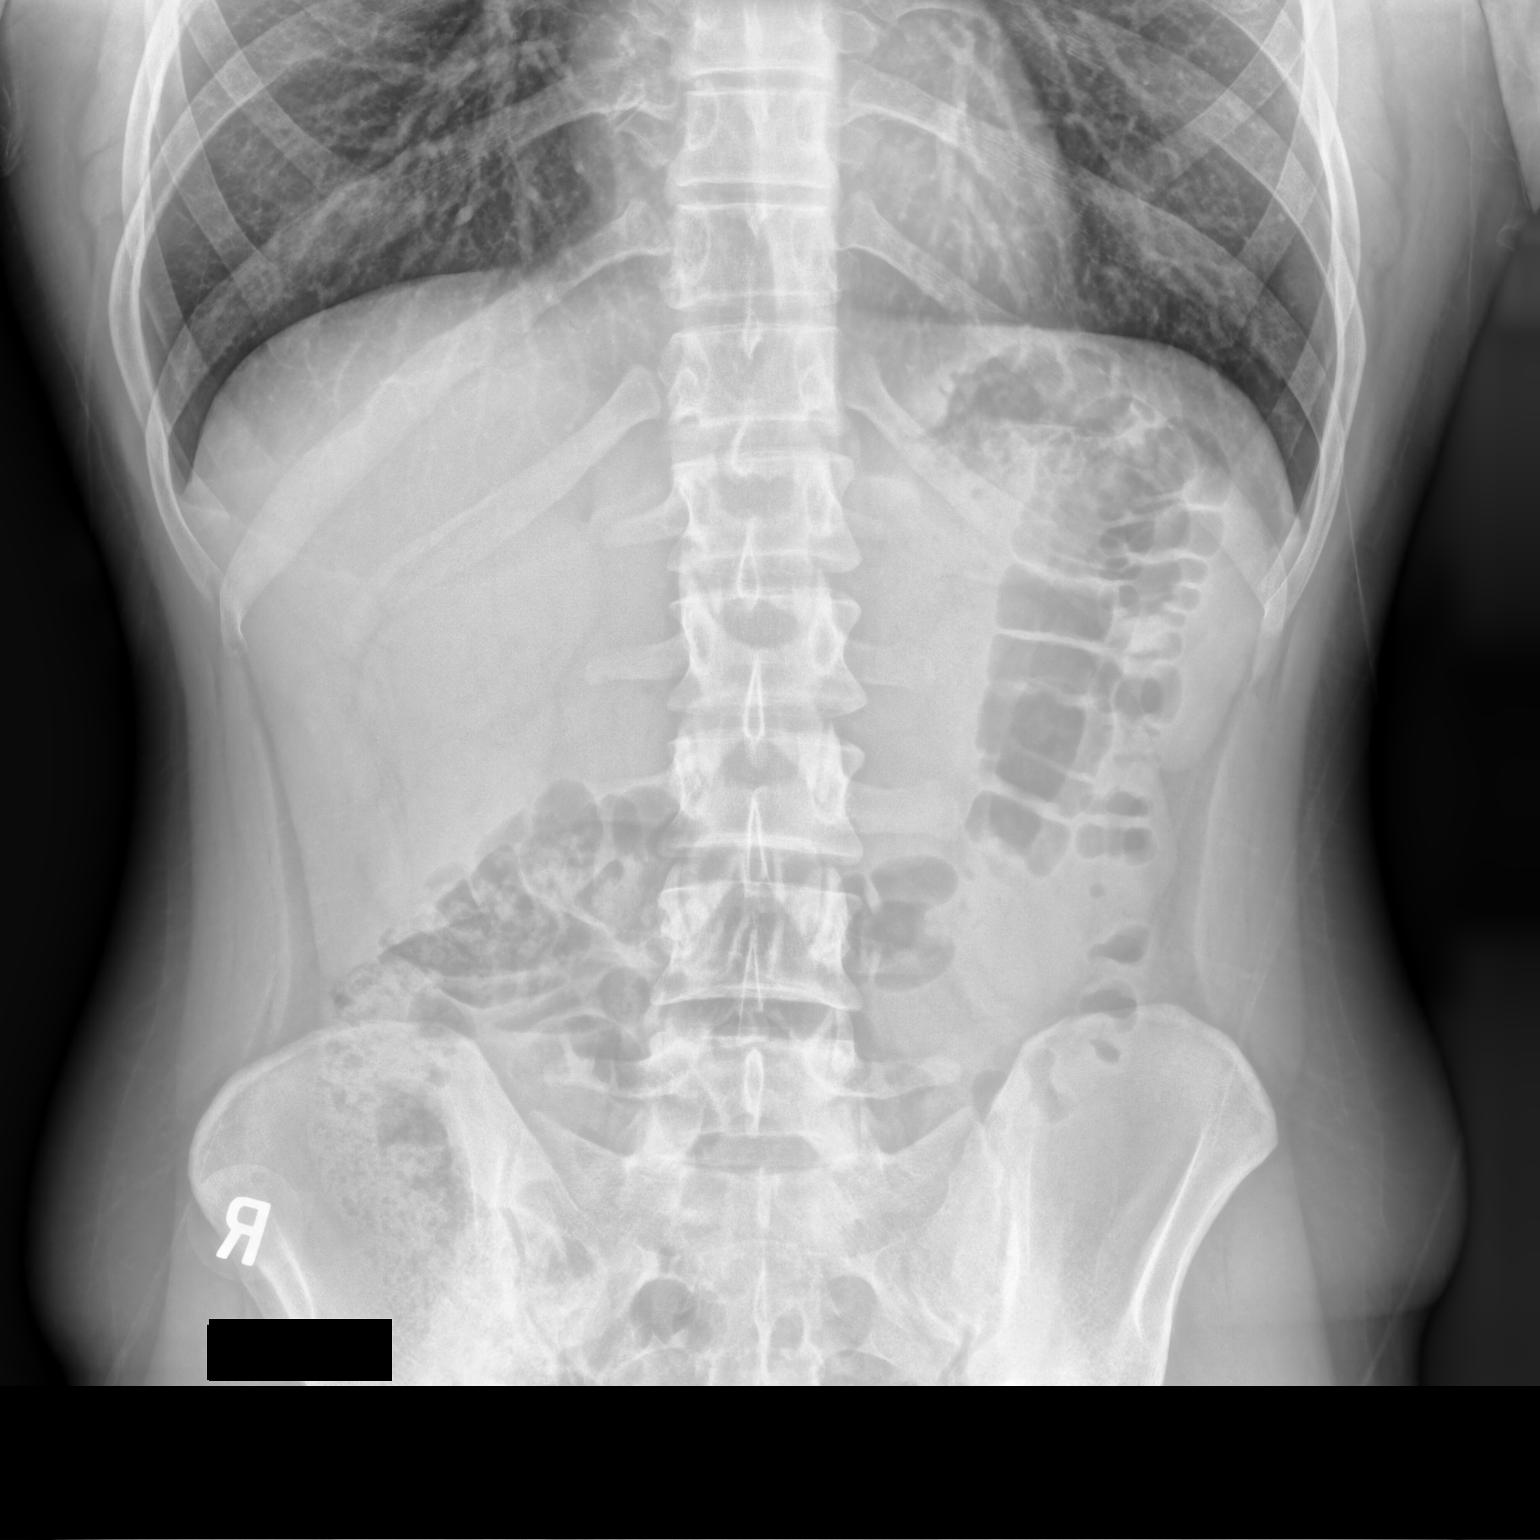

[abdomen supine ap]
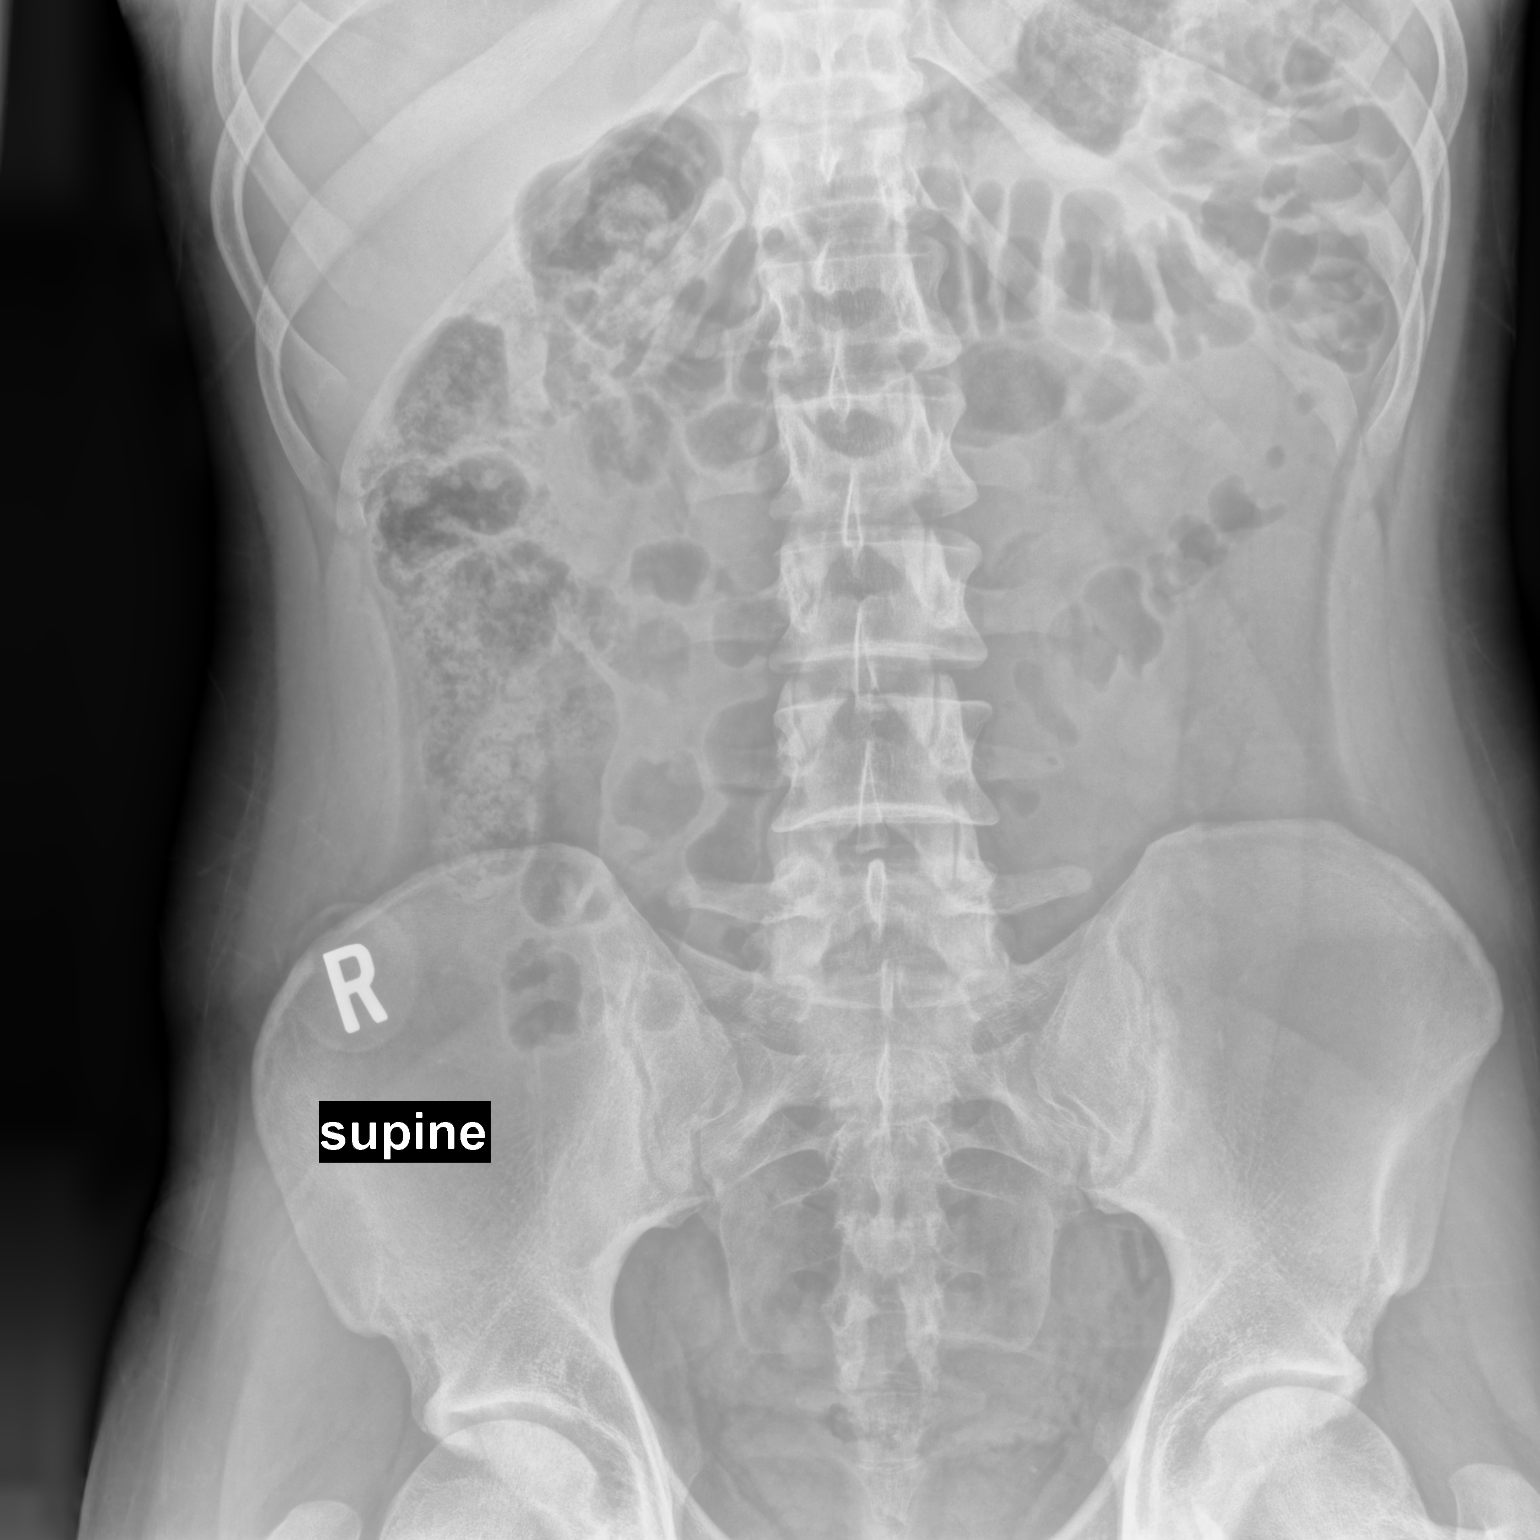

[2 of 2 positions shown; findings below may reference images not displayed]

FINDINGS: Nonspecific bowel gas pattern. Mild volume of stool in the colon. No
radiodense calculi. No gross free intraperitoneal air. Lung bases
clear. Osseous structures unremarkable.
IMPRESSION: Nonspecific bowel gas pattern.

## 2019-11-24 ENCOUNTER — Ambulatory Visit: Payer: Medicaid Other | Admitting: Family Medicine

## 2019-11-26 ENCOUNTER — Ambulatory Visit: Payer: Medicaid Other | Admitting: Family Medicine

## 2019-11-27 ENCOUNTER — Ambulatory Visit
Admission: EM | Admit: 2019-11-27 | Discharge: 2019-11-27 | Disposition: A | Discharge status: Home / Self Care | Payer: Medicaid Other

## 2019-11-27 ENCOUNTER — Other Ambulatory Visit: Payer: Self-pay

## 2019-11-27 DIAGNOSIS — L84 Corns and callosities: Secondary | ICD-10-CM

## 2019-11-27 HISTORY — DX: Unspecified asthma, uncomplicated: J45.909

## 2019-11-27 NOTE — Discharge Instructions (Signed)
Continue conservative management of rest, ice, and elevate as needed May benefit from corn cushions that you can buy over the counter Use ibuprofen and/or tylenol as needed Follow up with PCP or foot or ankle specialist as needed Return or go to the ER if you have any new or worsening symptoms (fever, chills, chest pain, pain, swelling, redness, discharge, worsening symptoms despite treatment, etc...)

## 2019-11-27 NOTE — ED Triage Notes (Signed)
Pt reports pain in L foot.  States he had wart previously that had to be frozen off and he wants to see if this is the same or just a callous.  Has been there for a few months.  Requesting work note.

## 2019-11-27 NOTE — ED Provider Notes (Signed)
[facility]   [id number] 11/27/19 Arrival Time: 7782  CC: Foot PAIN  SUBJECTIVE: History from: patient. Stephen Meyer is a 19 y.o. male complains of acute on chronic callus to LT foot worsened over the past few days.  Speculates it is secondary to walking on the outside of his feet.  Denies pain or dysfunction.  Denies aggravating or alleviating factors.  Denies fever, chills, nausea, vomiting, redness, swelling, discharge.       ROS: As per HPI.  All other pertinent ROS negative.     Past Medical History:  Diagnosis Date  . Asthma    Past Surgical History:  Procedure Laterality Date  . JOINT REPLACEMENT     ankle sugery   No Known Allergies No current facility-administered medications on file prior to encounter.   No current outpatient medications on file prior to encounter.   Social History   Socioeconomic History  . Marital status: Single    Spouse name: Not on file  . Number of children: Not on file  . Years of education: Not on file  . Highest education level: Not on file  Occupational History  . Not on file  Tobacco Use  . Smoking status: Never Smoker  . Smokeless tobacco: Never Used  Substance and Sexual Activity  . Alcohol use: Yes    Comment: occasionally  . Drug use: Yes    Types: Marijuana  . Sexual activity: Yes    Birth control/protection: Condom  Other Topics Concern  . Not on file  Social History Narrative  . Not on file   Social Determinants of Health   Financial Resource Strain:   . Difficulty of Paying Living Expenses:   Food Insecurity:   . Worried About Charity fundraiser in the Last Year:   . Arboriculturist in the Last Year:   Transportation Needs:   . Film/video editor (Medical):   Marland Kitchen Lack of Transportation (Non-Medical):   Physical Activity:   . Days of Exercise per Week:   . Minutes of Exercise per Session:   Stress:   . Feeling of Stress :   Social Connections:   . Frequency of Communication with Friends  and Family:   . Frequency of Social Gatherings with Friends and Family:   . Attends Religious Services:   . Active Member of Clubs or Organizations:   . Attends Archivist Meetings:   Marland Kitchen Marital Status:   Intimate Partner Violence:   . Fear of Current or Ex-Partner:   . Emotionally Abused:   Marland Kitchen Physically Abused:   . Sexually Abused:    Family History  Adopted: Yes  Problem Relation Age of Onset  . Prostate cancer Father   . Cancer Father   . Diabetes Maternal Grandmother   . Diabetes Paternal Grandmother     OBJECTIVE:  Vitals:   11/27/19 1525  BP: 121/78  Pulse: (!) 53  Resp: 16  Temp: 98.3 F (36.8 C)  TempSrc: Oral  SpO2: 98%    General appearance: ALERT; in no acute distress.  Head: NCAT Lungs: Normal respiratory effort CV: Dorsalis pedis pulses 2+. Cap refill < 2 seconds Musculoskeletal: LT foot Inspection: Callus present to lateral fifth MTP joint, no obvious wart present Palpation: Nontender to palpation ROM: FROM active and passive Skin: warm and dry Neurologic: Ambulates without difficulty; Sensation intact about the lower extremities Psychological: alert and cooperative; normal mood and affect  ASSESSMENT & PLAN:  1. Foot callus  Continue conservative management of rest, ice, and elevate as needed May benefit from corn cushions that you can buy over the counter Use ibuprofen and/or tylenol as needed Follow up with PCP or foot or ankle specialist as needed Return or go to the ER if you have any new or worsening symptoms (fever, chills, chest pain, pain, swelling, redness, discharge, worsening symptoms despite treatment, etc...)   Reviewed expectations re: course of current medical issues. Questions answered. Outlined signs and symptoms indicating need for more acute intervention. Patient verbalized understanding. After Visit Summary given.    Rennis Harding, PA-C 11/27/19 1548

## 2019-12-01 ENCOUNTER — Encounter: Payer: Self-pay | Admitting: Family Medicine

## 2020-01-14 ENCOUNTER — Encounter: Payer: Self-pay | Admitting: Emergency Medicine

## 2020-01-14 ENCOUNTER — Other Ambulatory Visit: Payer: Self-pay

## 2020-01-14 ENCOUNTER — Ambulatory Visit
Admission: EM | Admit: 2020-01-14 | Discharge: 2020-01-14 | Disposition: A | Discharge status: Home / Self Care | Payer: Medicaid Other | Attending: Emergency Medicine | Admitting: Emergency Medicine

## 2020-01-14 DIAGNOSIS — G8929 Other chronic pain: Secondary | ICD-10-CM

## 2020-01-14 DIAGNOSIS — M545 Low back pain, unspecified: Secondary | ICD-10-CM

## 2020-01-14 MED ORDER — NAPROXEN 375 MG PO TABS: 375.0000 mg | ORAL_TABLET | Freq: Two times a day (BID) | ORAL | 0 refills | Status: DC

## 2020-01-14 MED ORDER — CYCLOBENZAPRINE HCL 10 MG PO TABS: 10.0000 mg | ORAL_TABLET | Freq: Every day | ORAL | 0 refills | Status: DC

## 2020-01-14 NOTE — ED Provider Notes (Signed)
[facility]   [id number] 01/14/20 Arrival Time: 1559  CC: back pain  SUBJECTIVE: History from: patient. Stephen Meyer is a 19 y.o. male complains of LT lower back pain that began years ago.  Denies a precipitating event or specific injury.  Also requests work note.  Localizes the pain to the LT low back.  Describes the pain as intermittent and stretched in character.  Has not tried OTC medications.  Symptoms are made worse with straightening back.  Reports similar symptoms in the past.  Denies fever, chills, erythema, ecchymosis, effusion, weakness, numbness and tingling, saddle paresthesias, loss of bowel or bladder function.      ROS: As per HPI.  All other pertinent ROS negative.     Past Medical History:  Diagnosis Date  . Asthma    Past Surgical History:  Procedure Laterality Date  . JOINT REPLACEMENT     ankle sugery   No Known Allergies No current facility-administered medications on file prior to encounter.   No current outpatient medications on file prior to encounter.   Social History   Socioeconomic History  . Marital status: Single    Spouse name: Not on file  . Number of children: Not on file  . Years of education: Not on file  . Highest education level: Not on file  Occupational History  . Not on file  Tobacco Use  . Smoking status: Never Smoker  . Smokeless tobacco: Never Used  Substance and Sexual Activity  . Alcohol use: Yes    Comment: occasionally  . Drug use: Yes    Types: Marijuana  . Sexual activity: Yes    Birth control/protection: Condom  Other Topics Concern  . Not on file  Social History Narrative  . Not on file   Social Determinants of Health   Financial Resource Strain:   . Difficulty of Paying Living Expenses:   Food Insecurity:   . Worried About Programme researcher, broadcasting/film/video in the Last Year:   . Barista in the Last Year:   Transportation Needs:   . Freight forwarder (Medical):   Marland Kitchen Lack of Transportation  (Non-Medical):   Physical Activity:   . Days of Exercise per Week:   . Minutes of Exercise per Session:   Stress:   . Feeling of Stress :   Social Connections:   . Frequency of Communication with Friends and Family:   . Frequency of Social Gatherings with Friends and Family:   . Attends Religious Services:   . Active Member of Clubs or Organizations:   . Attends Banker Meetings:   Marland Kitchen Marital Status:   Intimate Partner Violence:   . Fear of Current or Ex-Partner:   . Emotionally Abused:   Marland Kitchen Physically Abused:   . Sexually Abused:    Family History  Adopted: Yes  Problem Relation Age of Onset  . Prostate cancer Father   . Cancer Father   . Diabetes Maternal Grandmother   . Diabetes Paternal Grandmother     OBJECTIVE:  Vitals:   01/14/20 1615  BP: (!) 144/71  Pulse: 63  Resp: 17  Temp: 98.4 F (36.9 C)  TempSrc: Oral  SpO2: 98%    General appearance: ALERT; in no acute distress.  Head: NCAT Lungs: Normal respiratory effort; CTAB CV: RRR Musculoskeletal: Back  Inspection: Skin warm, dry, clear and intact without obvious erythema, effusion, or ecchymosis.  Palpation: mildly TTP over LT mid to low back ROM: FROM active and  passive Strength: 5/5 shld abduction, 5/5 shld adduction, 5/5 elbow flexion, 5/5 elbow extension, 5/5 grip strength, 5/5 hip flexion, 5/5 hip extension Skin: warm and dry Neurologic: Ambulates without difficulty; Sensation intact about the upper/ lower extremities Psychological: alert and cooperative; normal mood and affect  ASSESSMENT & PLAN:  1. Chronic left-sided low back pain without sciatica     Meds ordered this encounter  Medications  . naproxen (NAPROSYN) 375 MG tablet    Sig: Take 1 tablet (375 mg total) by mouth 2 (two) times daily.    Dispense:  20 tablet    Refill:  0    Order Specific Question:   Supervising Provider    Answer:   Raylene Everts [[id number]]  . cyclobenzaprine (FLEXERIL) 10 MG tablet    Sig:  Take 1 tablet (10 mg total) by mouth at bedtime.    Dispense:  15 tablet    Refill:  0    Order Specific Question:   Supervising Provider    Answer:   Raylene Everts [[id number]]   Continue conservative management of rest, ice, and gentle stretches Take naproxen as needed for pain relief (may cause abdominal discomfort, ulcers, and GI bleeds avoid taking with other NSAIDs) Take cyclobenzaprine at nighttime for symptomatic relief. Avoid driving or operating heavy machinery while using medication. Follow up with PCP or back specialist if symptoms persist Return or go to the ER if you have any new or worsening symptoms (fever, chills, chest pain, abdominal pain, changes in bowel or bladder habits, pain radiating into lower legs, etc...)   Reviewed expectations re: course of current medical issues. Questions answered. Outlined signs and symptoms indicating need for more acute intervention. Patient verbalized understanding. After Visit Summary given.    Lestine Box, PA-C 01/14/20 1621

## 2020-01-14 NOTE — Discharge Instructions (Signed)
Continue conservative management of rest, ice, and gentle stretches Take naproxen as needed for pain relief (may cause abdominal discomfort, ulcers, and GI bleeds avoid taking with other NSAIDs) Take cyclobenzaprine at nighttime for symptomatic relief. Avoid driving or operating heavy machinery while using medication. Follow up with PCP or back specialist if symptoms persist Return or go to the ER if you have any new or worsening symptoms (fever, chills, chest pain, abdominal pain, changes in bowel or bladder habits, pain radiating into lower legs, etc...)

## 2020-01-14 NOTE — ED Triage Notes (Signed)
Provider triage  

## 2020-07-19 ENCOUNTER — Other Ambulatory Visit: Payer: Self-pay

## 2020-07-19 ENCOUNTER — Ambulatory Visit
Admission: EM | Admit: 2020-07-19 | Discharge: 2020-07-19 | Disposition: A | Discharge status: Home / Self Care | Payer: Medicaid Other

## 2020-07-19 DIAGNOSIS — R0789 Other chest pain: Secondary | ICD-10-CM

## 2020-07-19 NOTE — Discharge Instructions (Signed)
Symptoms consistent with musculoskeletal injury Continue conservative management of rest, ice, and gentle stretches Alternate ibuprofen and/or tyelnol as needed Follow up with PCP if symptoms persist Return or go to the ER if you have any new or worsening symptoms (fever, chills, chest pain, shortness of breath, abdominal pain, changes in bowel or bladder habits,  etc...)

## 2020-07-19 NOTE — ED Triage Notes (Signed)
Pt presents with c/o chest pain that he states he has had chest pain for past several weeks, hurts worse with inspiration and feels better with activity

## 2020-07-19 NOTE — ED Provider Notes (Addendum)
[facility]   [id number] 07/19/20 Arrival Time: 1001  CC: Chest wall PAIN  SUBJECTIVE: History from: patient. Stephen Meyer is a 19 y.o. male complains of chest wall pain x several weeks.  Denies a precipitating event or specific injury.  Localizes the pain to the RT side of breast bone.  Describes the pain as intermittent and sharp in character.  Denies alleviating factors.  Symptoms are made worse with deep breath and activity.  Denies similar symptoms in the past.  Denies fever, chills, chest pain, SOB, abdominal pain.     Requests out of work note.    ROS: As per HPI.  All other pertinent ROS negative.     Past Medical History:  Diagnosis Date   Asthma    Past Surgical History:  Procedure Laterality Date   JOINT REPLACEMENT     ankle sugery   No Known Allergies No current facility-administered medications on file prior to encounter.   Current Outpatient Medications on File Prior to Encounter  Medication Sig Dispense Refill   naproxen (NAPROSYN) 375 MG tablet Take 1 tablet (375 mg total) by mouth 2 (two) times daily. 20 tablet 0   Social History   Socioeconomic History   Marital status: Single    Spouse name: Not on file   Number of children: Not on file   Years of education: Not on file   Highest education level: Not on file  Occupational History   Not on file  Tobacco Use   Smoking status: Never Smoker   Smokeless tobacco: Never Used  Vaping Use   Vaping Use: Some days  Substance and Sexual Activity   Alcohol use: Yes    Comment: occasionally   Drug use: Yes    Types: Marijuana   Sexual activity: Yes    Birth control/protection: Condom  Other Topics Concern   Not on file  Social History Narrative   Not on file   Social Determinants of Health   Financial Resource Strain:    Difficulty of Paying Living Expenses: Not on file  Food Insecurity:    Worried About Running Out of Food in the Last Year: Not on file   [employer]  of Food in the Last Year: Not on file  Transportation Needs:    Lack of Transportation (Medical): Not on file   Lack of Transportation (Non-Medical): Not on file  Physical Activity:    Days of Exercise per Week: Not on file   Minutes of Exercise per Session: Not on file  Stress:    Feeling of Stress : Not on file  Social Connections:    Frequency of Communication with Friends and Family: Not on file   Frequency of Social Gatherings with Friends and Family: Not on file   Attends Religious Services: Not on file   Active Member of Clubs or Organizations: Not on file   Attends Banker Meetings: Not on file   Marital Status: Not on file  Intimate Partner Violence:    Fear of Current or Ex-Partner: Not on file   Emotionally Abused: Not on file   Physically Abused: Not on file   Sexually Abused: Not on file   Family History  Adopted: Yes  Problem Relation Age of Onset   Prostate cancer Father    Cancer Father    Diabetes Maternal Grandmother    Diabetes Paternal Grandmother     OBJECTIVE:  There were no vitals filed for this visit.  General appearance: ALERT; in  no acute distress.  Head: NCAT Lungs: Normal respiratory effort; CTAB CV: rrr Musculoskeletal: Chest wall Inspection: Skin warm, dry, clear and intact without obvious erythema, effusion, or ecchymosis.  Palpation: TTP over distal RT side of sternum ROM: FROM about the UEs Strength: 5/5 pectoralis flexion Skin: warm and dry Neurologic: Ambulates without difficulty; Sensation intact about the upper/ lower extremities Psychological: alert and cooperative; normal mood and affect   ASSESSMENT & PLAN:  1. Chest wall pain    Symptoms consistent with musculoskeletal injury Continue conservative management of rest, ice, and gentle stretches Alternate ibuprofen and/or tylenol as needed Follow up with PCP if symptoms persist Return or go to the ER if you have any new or worsening symptoms  (fever, chills, chest pain, shortness of breath, abdominal pain, changes in bowel or bladder habits,  etc...)   Reviewed expectations re: course of current medical issues. Questions answered. Outlined signs and symptoms indicating need for more acute intervention. Patient verbalized understanding. After Visit Summary given.    Rennis Harding, PA-C 07/19/20 1053    Alvino Chapel Napoleon, PA-C 07/19/20 1056

## 2020-12-29 ENCOUNTER — Encounter: Payer: Self-pay | Admitting: Emergency Medicine

## 2020-12-29 ENCOUNTER — Ambulatory Visit
Admission: EM | Admit: 2020-12-29 | Discharge: 2020-12-29 | Disposition: A | Discharge status: Home / Self Care | Payer: Medicaid Other

## 2020-12-29 ENCOUNTER — Ambulatory Visit
Admission: EM | Admit: 2020-12-29 | Discharge: 2020-12-29 | Discharge status: Against Medical Advice | Payer: Medicaid Other

## 2020-12-29 ENCOUNTER — Other Ambulatory Visit: Payer: Self-pay

## 2020-12-29 DIAGNOSIS — Z0289 Encounter for other administrative examinations: Secondary | ICD-10-CM

## 2020-12-29 DIAGNOSIS — K1379 Other lesions of oral mucosa: Secondary | ICD-10-CM

## 2020-12-29 NOTE — Discharge Instructions (Signed)
Work note given Use orajel OTC as needed

## 2020-12-29 NOTE — ED Notes (Signed)
Patient just came out of room and stated that he has to leave, he has some where he needs to be and can come back at a later time.

## 2020-12-29 NOTE — ED Provider Notes (Signed)
  [facility]   [id number] 12/29/20 Arrival Time: 1550  CC: mouth sore   SUBJECTIVE:  Stephen Meyer is a 20 y.o. male who presents for work note.  Complains of sore in mouth that has been bleeding for 2 days.  States improved.  Unable to perform job duties due to sore in mouth per patient.  Denies a precipitating event or trauma.  Denies concern for STDs.  Denies alleviating or aggravating factors.  Patient comes here frequently for work notes.  Denies fever, chills, dysphagia, odynophagia, oral or neck swelling, nausea, vomiting, chest pain, SOB.    ROS: As per HPI.  All other pertinent ROS negative.     Past Medical History:  Diagnosis Date  . Asthma    Past Surgical History:  Procedure Laterality Date  . JOINT REPLACEMENT     ankle sugery   No Known Allergies No current facility-administered medications on file prior to encounter.   Current Outpatient Medications on File Prior to Encounter  Medication Sig Dispense Refill  . naproxen (NAPROSYN) 375 MG tablet Take 1 tablet (375 mg total) by mouth 2 (two) times daily. 20 tablet 0   Social History   Socioeconomic History  . Marital status: Single    Spouse name: Not on file  . Number of children: Not on file  . Years of education: Not on file  . Highest education level: Not on file  Occupational History  . Not on file  Tobacco Use  . Smoking status: Never Smoker  . Smokeless tobacco: Never Used  Vaping Use  . Vaping Use: Some days  Substance and Sexual Activity  . Alcohol use: Yes    Comment: occasionally  . Drug use: Yes    Types: Marijuana  . Sexual activity: Yes    Birth control/protection: Condom  Other Topics Concern  . Not on file  Social History Narrative  . Not on file   Social Determinants of Health   Financial Resource Strain: Not on file  Food Insecurity: Not on file  Transportation Needs: Not on file  Physical Activity: Not on file  Stress: Not on file  Social Connections: Not on  file  Intimate Partner Violence: Not on file   Family History  Adopted: Yes  Problem Relation Age of Onset  . Prostate cancer Father   . Cancer Father   . Diabetes Maternal Grandmother   . Diabetes Paternal Grandmother     OBJECTIVE:  Vitals:   12/29/20 1557  BP: 123/64  Pulse: 76  Resp: 18  Temp: 98.5 F (36.9 C)  TempSrc: Oral  SpO2: 98%    General appearance: alert; no distress HENT: normocephalic; atraumatic; dentition: intact, no obvious sores, bleeding, drainage, tolerating own secretions, speaking in full sentences Neck: supple without LAD Lungs: normal respirations Skin: warm and dry Psychological: alert and cooperative; normal mood and affect  ASSESSMENT & PLAN:  1. Sore in mouth   2. Encounter to obtain excuse from work    Work note given Use orajel OTC as needed  Reviewed expectations re: course of current medical issues. Questions answered. Outlined signs and symptoms indicating need for more acute intervention. Patient verbalized understanding. After Visit Summary given.   Rennis Harding, PA-C 12/29/20 1607

## 2020-12-29 NOTE — ED Triage Notes (Signed)
Needs a note to be able to return to work.  States he coughed up blood late last night.  States the top of this mouth was hurting and thinks the blood may have come from that area.

## 2020-12-29 NOTE — ED Triage Notes (Signed)
Sore in mouth that is causing bleeding x 2 days.

## 2021-02-09 ENCOUNTER — Other Ambulatory Visit: Payer: Self-pay

## 2021-02-09 ENCOUNTER — Encounter: Payer: Self-pay | Admitting: Emergency Medicine

## 2021-02-09 ENCOUNTER — Ambulatory Visit
Admission: EM | Admit: 2021-02-09 | Discharge: 2021-02-09 | Disposition: A | Discharge status: Home / Self Care | Payer: Medicaid Other | Attending: Internal Medicine | Admitting: Internal Medicine

## 2021-02-09 DIAGNOSIS — T679XXA Effect of heat and light, unspecified, initial encounter: Secondary | ICD-10-CM | POA: Diagnosis not present

## 2021-02-09 DIAGNOSIS — R5383 Other fatigue: Secondary | ICD-10-CM | POA: Diagnosis not present

## 2021-02-09 LAB — POCT URINALYSIS DIP (MANUAL ENTRY)
Bilirubin, UA: NEGATIVE
Blood, UA: NEGATIVE
Glucose, UA: NEGATIVE mg/dL
Ketones, POC UA: NEGATIVE mg/dL
Leukocytes, UA: NEGATIVE
Nitrite, UA: NEGATIVE
Protein Ur, POC: NEGATIVE mg/dL
Spec Grav, UA: 1.015 (ref 1.010–1.025)
Urobilinogen, UA: 0.2 E.U./dL
pH, UA: 6 (ref 5.0–8.0)

## 2021-02-09 NOTE — ED Provider Notes (Signed)
[facility]    CSN: [id number] Arrival date & time: 02/09/21  1117      History   Chief Complaint No chief complaint on file.   HPI Stephen Meyer is a 20 y.o. 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 The day before he worked in the heat and though he drank water all day and voided every time he drank a bottle, he still did not feel he was hydrating.   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 He needs a note in order to miss work tomorrow.    Past Medical History:  Diagnosis Date  . Asthma     Patient Active Problem List   Diagnosis Date Noted  . 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 Obesity, unspecified 01/21/2013  . Exercise-induced asthma 01/21/2013  . Foster care (status) 01/21/2013    Past Surgical History:  Procedure Laterality Date  . JOINT REPLACEMENT     ankle sugery       Home Medications    Prior to Admission medications   Not on File    Family History Family History  Adopted: Yes  Problem Relation Age of Onset  . Prostate cancer Father   . Cancer Father   . Diabetes Maternal Grandmother   . Diabetes Paternal Grandmother     Social History Social History   Tobacco Use  . Smoking status: Never Smoker  . Smokeless tobacco: Never Used  Vaping Use  . Vaping Use: Some days  Substance Use Topics  . Alcohol use: Yes    Comment: occasionally  . Drug use: Yes    Types: Marijuana     Allergies   Patient has no known allergies.   Review of Systems Review of Systems  Constitutional: Positive for activity change and fatigue. Negative for appetite change.  HENT: Negative for congestion.   Respiratory: Negative for chest  tightness and shortness of breath.   Cardiovascular: Negative for chest pain.  Gastrointestinal: Negative for nausea and vomiting.  Endocrine: Positive for polydipsia. Negative for polyphagia.  Musculoskeletal: Negative for myalgias.  Skin: Negative for rash.  Neurological: Positive for dizziness. Negative for syncope and headaches.     Physical Exam Triage Vital Signs ED Triage Vitals  Enc Vitals Group     BP 02/09/21 1205 (!) 161/89     Pulse Rate 02/09/21 1205 89     Resp 02/09/21 1205 18     Temp 02/09/21 1205 98.1 F (36.7 C)     Temp Source 02/09/21 1205 Oral     SpO2 02/09/21 1205 96 %     Weight --      Height --      Head Circumference --      Peak Flow --      Pain Score 02/09/21 1207 0     Pain Loc --      Pain Edu? --      Excl. in GC? --    Orthostatic VS for the past 24 hrs:  BP- Lying Pulse- Lying BP- Sitting Pulse- Sitting BP- Standing at 0 minutes Pulse- Standing at 0 minutes  02/09/21 1253 138/80 73 140/78 76 109/77 92    Updated Vital Signs BP (!) 161/89 (BP Location: Right Arm)   Pulse 89   Temp 98.1 F (36.7 C) (Oral)   Resp 18   SpO2 96%   Visual Acuity Right Eye Distance:   Left Eye Distance:   Bilateral Distance:    Right Eye Near:   Left Eye Near:    Bilateral Near:     Physical Exam  Physical Exam Vitals signs and nursing note reviewed.  Constitutional:      General: She is not in acute distress.    Appearance: Normal appearance. She is not ill-appearing, toxic-appearing or diaphoretic.  HENT:     Head: Normocephalic.     Right Ear: Tympanic membrane, ear canal and external ear normal.     Left Ear: Tympanic membrane, ear canal and external ear normal.     Nose: Nose normal.     Mouth/Throat:     Mouth: Mucous membranes are moist.  Eyes:     General: No scleral icterus.       Right eye: No discharge.        Left eye: No discharge.     Conjunctiva/sclera: Conjunctivae normal.  Neck:     Musculoskeletal: Neck supple. No  neck rigidity.  Cardiovascular:     Rate and Rhythm: Normal rate and regular rhythm.     Heart sounds: No murmur.  Pulmonary:     Effort: Pulmonary effort is normal.     Breath sounds: Normal breath sounds.   Musculoskeletal: Normal range of motion.  Lymphadenopathy:     Cervical: No cervical adenopathy.  Skin:    General: Skin is warm and dry.     Coloration: Skin is not jaundiced.     Findings: No rash.  Neurological:     Mental Status: She is alert and oriented to person, place, and time.     Gait: Gait normal.  Psychiatric:        Mood and Affect: Mood normal.        Behavior: Behavior normal.        Thought Content: Thought content normal.        Judgment: Judgment normal.    UC Treatments / Results  Labs (all labs ordered are listed, but only abnormal results are displayed) Labs Reviewed  COMPREHENSIVE METABOLIC PANEL  POCT URINALYSIS DIP (MANUAL ENTRY)    EKG   Radiology No results found.  Procedures Procedures (including critical care time)  Medications Ordered in UC Medications - No data to display  Initial Impression / Assessment and Plan / UC Course  I have reviewed the triage vital signs and the nursing notes. Pertinent labs  results that were available during my care of the patient were reviewed by me and considered in my medical decision making (see chart for details). Fatigue/ drained, could have hyponatremia. Was not symptomatic with orthostatic BP's and his UA is good, with no signs of dehydration. I ordered BMP to see  If his sodium is low. We will call him if abnormal. Note for work given.  Final Clinical Impressions(s) / UC Diagnoses   Final diagnoses:  Other fatigue  Heat exposure, initial encounter   Discharge Instructions   None    ED Prescriptions    None     PDMP not reviewed this encounter.   Garey Ham, PA-C 02/09/21 1315

## 2021-02-09 NOTE — ED Triage Notes (Signed)
States he woke up yesterday feeling dehydrated.  States he went to work yesterday and still feels the same.  Slight headache and feels cloudy headed.

## 2021-02-10 LAB — COMPREHENSIVE METABOLIC PANEL
ALT: 55 IU/L — ABNORMAL HIGH (ref 0–44)
AST: 35 IU/L (ref 0–40)
Albumin/Globulin Ratio: 2.3 — ABNORMAL HIGH (ref 1.2–2.2)
Albumin: 4.9 g/dL (ref 4.1–5.2)
Alkaline Phosphatase: 82 IU/L (ref 51–125)
BUN/Creatinine Ratio: 10 (ref 9–20)
BUN: 10 mg/dL (ref 6–20)
Bilirubin Total: 0.7 mg/dL (ref 0.0–1.2)
CO2: 22 mmol/L (ref 20–29)
Calcium: 9.5 mg/dL (ref 8.7–10.2)
Chloride: 103 mmol/L (ref 96–106)
Creatinine, Ser: 0.98 mg/dL (ref 0.76–1.27)
Globulin, Total: 2.1 g/dL (ref 1.5–4.5)
Glucose: 82 mg/dL (ref 65–99)
Potassium: 3.9 mmol/L (ref 3.5–5.2)
Sodium: 140 mmol/L (ref 134–144)
Total Protein: 7 g/dL (ref 6.0–8.5)
eGFR: 113 mL/min/{1.73_m2} (ref >59)

## 2021-11-23 ENCOUNTER — Other Ambulatory Visit: Payer: Self-pay

## 2021-11-23 ENCOUNTER — Ambulatory Visit
Admission: EM | Admit: 2021-11-23 | Discharge: 2021-11-23 | Disposition: A | Discharge status: Home / Self Care | Payer: Managed Care, Other (non HMO) | Attending: Family Medicine | Admitting: Family Medicine

## 2021-11-23 DIAGNOSIS — R112 Nausea with vomiting, unspecified: Secondary | ICD-10-CM | POA: Diagnosis not present

## 2021-11-23 MED ORDER — ONDANSETRON 4 MG PO TBDP: 4.0000 mg | ORAL_TABLET | Freq: Three times a day (TID) | ORAL | 0 refills | Status: DC | PRN

## 2021-11-23 NOTE — ED Triage Notes (Signed)
Pt states he has been vomiting since Monday ? ?Pt states he felt better on Wednesday morning but started vomiting mucus Wednesday night ? ?Denies Fever ? ?Denies Meds ?

## 2021-11-23 NOTE — ED Provider Notes (Signed)
?[facility] ? ? ? ?CSN: [id number] ?Arrival date & time: 11/23/21  5956 ? ? ?  ? ?History   ?Chief Complaint ?Chief Complaint  ?Patient presents with  ? Emesis  ?  Vomit and stomach pain  ? ? ?HPI ?Stephen Meyer is a 21 y.o. male.  ? ?Presenting today with 3-day history of nausea, vomiting.  Denies fever, chills, body aches, chest pain, shortness of breath, diarrhea, significant abdominal pain.  So far not trying anything over-the-counter for symptoms.  Multiple sick contacts at home with similar symptoms.  No new foods or medications, recent travel. ? ?Past Medical History:  ?Diagnosis Date  ? Asthma   ? ? ?Patient Active Problem List  ? Diagnosis Date Noted  ? Verruca vulgaris 01/21/2013  ? Obesity, unspecified 01/21/2013  ? Exercise-induced asthma 01/21/2013  ? Foster care (status) 01/21/2013  ? ? ?Past Surgical History:  ?Procedure Laterality Date  ? JOINT REPLACEMENT    ? ankle sugery  ? ? ? ? ? ?Home Medications   ? ?Prior to Admission medications   ?Medication Sig Start Date End Date Taking? Authorizing Provider  ?ondansetron (ZOFRAN-ODT) 4 MG disintegrating tablet Take 1 tablet (4 mg total) by mouth every 8 (eight) hours as needed for nausea or vomiting. 11/23/21  Yes Particia Nearing, PA-C  ? ? ?Family History ?Family History  ?Adopted: Yes  ?Problem Relation Age of Onset  ? Prostate cancer Father   ? Cancer Father   ? Diabetes Maternal Grandmother   ? Diabetes Paternal Grandmother   ? ? ?Social History ?Social History  ? ?Tobacco Use  ? Smoking status: Never  ? Smokeless tobacco: Never  ?Vaping Use  ? Vaping Use: Some days  ? Substances: Nicotine, Flavoring  ?Substance Use Topics  ? Alcohol use: Yes  ?  Comment: occasionally  ? Drug use: Yes  ?  Types: Marijuana  ? ? ? ?Allergies   ?Patient has no known allergies. ? ? ?Review of Systems ?Review of Systems ?Per HPI ? ?Physical Exam ?Triage Vital Signs ?ED Triage Vitals  ?Enc Vitals Group  ?   BP 11/23/21 1011 122/71  ?   Pulse Rate  11/23/21 1011 65  ?   Resp 11/23/21 1011 18  ?   Temp 11/23/21 1011 98 ?F (36.7 ?C)  ?   Temp Source 11/23/21 1011 Oral  ?   SpO2 11/23/21 1011 99 %  ?   Weight --   ?   Height --   ?   Head Circumference --   ?   Peak Flow --   ?   Pain Score 11/23/21 1009 0  ?   Pain Loc --   ?   Pain Edu? --   ?   Excl. in GC? --   ? ?No data found. ? ?Updated Vital Signs ?BP 122/71 (BP Location: Right Arm)   Pulse 65   Temp 98 ?F (36.7 ?C) (Oral)   Resp 18   SpO2 99%  ? ?Visual Acuity ?Right Eye Distance:   ?Left Eye Distance:   ?Bilateral Distance:   ? ?Right Eye Near:   ?Left Eye Near:    ?Bilateral Near:    ? ?Physical Exam ?Vitals and nursing note reviewed.  ?Constitutional:   ?   Appearance: Normal appearance.  ?HENT:  ?   Head: Atraumatic.  ?   Mouth/Throat:  ?   Mouth: Mucous membranes are moist.  ?Eyes:  ?   Extraocular Movements: Extraocular movements intact.  ?  Conjunctiva/sclera: Conjunctivae normal.  ?Cardiovascular:  ?   Rate and Rhythm: Normal rate and regular rhythm.  ?Pulmonary:  ?   Effort: Pulmonary effort is normal.  ?   Breath sounds: Normal breath sounds.  ?Abdominal:  ?   General: Bowel sounds are normal. There is no distension.  ?   Palpations: Abdomen is soft.  ?   Tenderness: There is no abdominal tenderness. There is no right CVA tenderness, left CVA tenderness or guarding.  ?Musculoskeletal:     ?   General: Normal range of motion.  ?   Cervical back: Normal range of motion and neck supple.  ?Skin: ?   General: Skin is warm and dry.  ?Neurological:  ?   General: No focal deficit present.  ?   Mental Status: He is oriented to person, place, and time.  ?   Motor: No weakness.  ?   Gait: Gait normal.  ?Psychiatric:     ?   Mood and Affect: Mood normal.     ?   Thought Content: Thought content normal.     ?   Judgment: Judgment normal.  ? ?UC Treatments / Results  ?Labs ?(all labs ordered are listed, but only abnormal results are displayed) ?Labs Reviewed - No data to  display ? ?EKG ? ? ?Radiology ?No results found. ? ?Procedures ?Procedures (including critical care time) ? ?Medications Ordered in UC ?Medications - No data to display ? ?Initial Impression / Assessment and Plan / UC Course  ?I have reviewed the triage vital signs and the nursing notes. ? ?Pertinent labs & imaging results that were available during my care of the patient were reviewed by me and considered in my medical decision making (see chart for details). ? ?  ? ?Suspect viral GI illness causing symptoms.  Zofran, brat diet, fluids reviewed.  Work note given.  Return for acutely worsening symptoms ? ?Final Clinical Impressions(s) / UC Diagnoses  ? ?Final diagnoses:  ?Nausea and vomiting, unspecified vomiting type  ? ?Discharge Instructions   ?None ?  ? ?ED Prescriptions   ? ? Medication Sig Dispense Auth. Provider  ? ondansetron (ZOFRAN-ODT) 4 MG disintegrating tablet Take 1 tablet (4 mg total) by mouth every 8 (eight) hours as needed for nausea or vomiting. 20 tablet Particia Nearing, [place]  ? ?  ? ?PDMP not reviewed this encounter. ?  ?Particia Nearing, PA-C ?11/23/21 1129 ? ?

## 2021-12-08 ENCOUNTER — Other Ambulatory Visit: Payer: Self-pay

## 2021-12-08 ENCOUNTER — Encounter: Payer: Self-pay | Admitting: Emergency Medicine

## 2021-12-08 ENCOUNTER — Ambulatory Visit
Admission: EM | Admit: 2021-12-08 | Discharge: 2021-12-08 | Disposition: A | Discharge status: Home / Self Care | Payer: Managed Care, Other (non HMO)

## 2021-12-08 DIAGNOSIS — L84 Corns and callosities: Secondary | ICD-10-CM

## 2021-12-08 NOTE — ED Provider Notes (Signed)
?[facility] ? ? ?MRN: [id number] DOB: Jan 21, 2001 ? ?Subjective:  ? ?Stephen Meyer is a 21 y.o. male presenting for an evaluation of a skin abnormality of the left foot.  Patient has concerns about this being a plantar wart.  Has previously had a difficult time with this in the past.  Denies any particular pain, drainage, redness.  No bleeding.  Symptoms came on rather quickly over a matter of days. ? ?No current facility-administered medications for this encounter. ? ?Current Outpatient Medications:  ?  ondansetron (ZOFRAN-ODT) 4 MG disintegrating tablet, Take 1 tablet (4 mg total) by mouth every 8 (eight) hours as needed for nausea or vomiting., Disp: 20 tablet, Rfl: 0  ? ?No Known Allergies ? ?Past Medical History:  ?Diagnosis Date  ? Asthma   ?  ? ?Past Surgical History:  ?Procedure Laterality Date  ? JOINT REPLACEMENT    ? ankle sugery  ? ? ?Family History  ?Adopted: Yes  ?Problem Relation Age of Onset  ? Prostate cancer Father   ? Cancer Father   ? Diabetes Maternal Grandmother   ? Diabetes Paternal Grandmother   ? ? ?Social History  ? ?Tobacco Use  ? Smoking status: Never  ? Smokeless tobacco: Never  ?Vaping Use  ? Vaping Use: Some days  ? Substances: Nicotine, Flavoring  ?Substance Use Topics  ? Alcohol use: Yes  ?  Comment: occasionally  ? Drug use: Yes  ?  Types: Marijuana  ? ? ?ROS ? ? ?Objective:  ? ?Vitals: ?BP 139/81 (BP Location: Right Arm)   Pulse 73   Temp 98.7 ?F (37.1 ?C) (Oral)   Resp 18   Ht 5\' 11"  (1.803 m)   Wt 180 lb (81.6 kg)   SpO2 96%   BMI 25.10 kg/m?  ? ?Physical Exam ?Constitutional:   ?   General: He is not in acute distress. ?   Appearance: Normal appearance. He is well-developed and normal weight. He is not ill-appearing, toxic-appearing or diaphoretic.  ?HENT:  ?   Head: Normocephalic and atraumatic.  ?   Right Ear: External ear normal.  ?   Left Ear: External ear normal.  ?   Nose: Nose normal.  ?   Mouth/Throat:  ?   Pharynx: Oropharynx is clear.  ?Eyes:   ?   General: No scleral icterus.    ?   Right eye: No discharge.     ?   Left eye: No discharge.  ?   Extraocular Movements: Extraocular movements intact.  ?Cardiovascular:  ?   Rate and Rhythm: Normal rate.  ?Pulmonary:  ?   Effort: Pulmonary effort is normal.  ?Musculoskeletal:  ?   Cervical back: Normal range of motion.  ?     Feet: ? ?Skin: ?   General: Skin is warm and dry.  ?Neurological:  ?   Mental Status: He is alert and oriented to person, place, and time.  ?Psychiatric:     ?   Mood and Affect: Mood normal.     ?   Behavior: Behavior normal.     ?   Thought Content: Thought content normal.     ?   Judgment: Judgment normal.  ? ? ?Assessment and Plan :  ? ?PDMP not reviewed this encounter. ? ?1. Callus of foot   ? ?Suspect callus over the area in question.  No signs of plantar wart.  Recommended follow-up with podiatry.  Patient states he will consider this.  He declined any particular treatments  at this time. ?  ?Wallis Bamberg, PA-C ?12/08/21 1224 ? ?

## 2021-12-08 NOTE — ED Triage Notes (Signed)
Pt reports left heel itching and raised area of dry skin.  ?

## 2021-12-08 NOTE — Discharge Instructions (Addendum)
[facility] ([place]) Podiatrist in [place], [place] COVID-19 info: [email or web address] Get online care: [email or web address] Address: [address], [place], [place] [address] Phone: [phone] Appointments: [email or web address]   [facility], [place], [place] Doctor in [place], [place] Address: [address], , [place] [address] Phone: [phone]  

## 2021-12-13 ENCOUNTER — Ambulatory Visit
Admission: EM | Admit: 2021-12-13 | Discharge: 2021-12-13 | Disposition: A | Discharge status: Home / Self Care | Payer: Managed Care, Other (non HMO) | Attending: Urgent Care | Admitting: Urgent Care

## 2021-12-13 ENCOUNTER — Ambulatory Visit (INDEPENDENT_AMBULATORY_CARE_PROVIDER_SITE_OTHER): Payer: Managed Care, Other (non HMO)

## 2021-12-13 DIAGNOSIS — M25562 Pain in left knee: Secondary | ICD-10-CM | POA: Diagnosis not present

## 2021-12-13 DIAGNOSIS — G8929 Other chronic pain: Secondary | ICD-10-CM

## 2021-12-13 MED ORDER — NAPROXEN 375 MG PO TABS: 375.0000 mg | ORAL_TABLET | Freq: Two times a day (BID) | ORAL | 0 refills | Status: AC

## 2021-12-13 NOTE — ED Triage Notes (Signed)
Pt reports on and off left knee pain x 2 months. Pt reports he lift and squat a lot at work. Pt has not taken any OTC meds.  ?

## 2021-12-13 NOTE — ED Provider Notes (Signed)
?[facility] ? ? ?MRN: [id number] DOB: 09/25/2000 ? ?Subjective:  ? ?Stephen Meyer is a 21 y.o. male presenting for 2 month history of persistent left knee pain, swelling. He is very active with his work, does a lot of lifting and repetitive squatting. No fall, trauma, redness, warmth, knee buckling. Has concerns about inner joint swelling. 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 Has a history of bilateral ankle injuries that regularly cause swelling. History of ankle surgery from a fracture. Has not taken any medications for his knee. No history of knee injuries, surgery.  ? ?No current facility-administered medications for this encounter. ? ?Current Outpatient Medications:  ?  ondansetron (ZOFRAN-ODT) 4 MG disintegrating tablet, Take 1 tablet (4 mg total) by mouth every 8 (eight) hours as needed for nausea or vomiting., Disp: 20 tablet, Rfl: 0  ? ?No Known Allergies ? ?Past Medical History:  ?Diagnosis Date  ? Asthma   ?  ? ?Past Surgical History:  ?Procedure Laterality Date  ? JOINT REPLACEMENT    ? ankle sugery  ? ? ?Family History  ?Adopted: Yes  ?Problem Relation Age of Onset  ? Prostate cancer Father   ? Cancer Father   ? Diabetes Maternal Grandmother   ? Diabetes Paternal Grandmother   ? ? ?Social History  ? ?Tobacco Use  ? Smoking status: Never  ? Smokeless tobacco: Never  ?Vaping Use  ? Vaping Use: Some days  ? Substances: Nicotine, Flavoring  ?Substance Use Topics  ? Alcohol use: Yes  ?  Comment: occasionally  ? Drug use: Yes  ?  Types: Marijuana  ? ? ?ROS ? ? ?Objective:  ? ?Vitals: ?BP 126/75 (BP Location: Right Arm)   Pulse 64   Temp 98.3 ?F (36.8 ?C) (Oral)   Resp 16   SpO2 99%  ? ?Physical Exam ?Constitutional:   ?   General: He is not in acute distress. ?   Appearance: Normal appearance. He is well-developed and normal weight. He is not ill-appearing, toxic-appearing or diaphoretic.  ?HENT:  ?   Head: Normocephalic and atraumatic.   ?   Right Ear: External ear normal.  ?   Left Ear: External ear normal.  ?   Nose: Nose normal.  ?   Mouth/Throat:  ?   Pharynx: Oropharynx is clear.  ?Eyes:  ?   General: No scleral icterus.    ?   Right eye: No discharge.     ?   Left eye: No discharge.  ?   Extraocular Movements: Extraocular movements intact.  ?Cardiovascular:  ?   Rate and Rhythm: Normal rate.  ?Pulmonary:  ?   Effort: Pulmonary effort is normal.  ?Musculoskeletal:  ?   Cervical back: Normal range of motion.  ?   Left knee: No swelling, deformity, effusion, erythema, ecchymosis, lacerations, bony tenderness or crepitus. Normal range of motion. Tenderness present over the patellar tendon. No medial joint line or lateral joint line tenderness. Normal alignment and normal patellar mobility.  ?Neurological:  ?   Mental Status: He is alert and oriented to person, place, and time.  ?Psychiatric:     ?   Mood and Affect: Mood normal.     ?   Behavior: Behavior normal.     ?   Thought Content: Thought content normal.     ?   Judgment: Judgment normal.  ? ?DG Knee Complete 4 Views Left ? ?Result Date: 12/13/2021 ?CLINICAL DATA:  Intermittent  left knee pain for 2 months EXAM: LEFT KNEE - COMPLETE 4+ VIEW COMPARISON:  None. FINDINGS: No evidence of fracture, dislocation, or joint effusion. No evidence of arthropathy or other focal bone abnormality. Soft tissues are unremarkable. IMPRESSION: Negative. If pain persists despite conservative therapy, MRI may be warranted for further characterization. Electronically Signed   By: Gaylyn Rong M.D.   On: 12/13/2021 12:05   ? ?Assessment and Plan :  ? ?PDMP not reviewed this encounter. ? ?1. Acute pain of left knee   ?2. Chronic pain of left knee   ? ?Suspect inflammatory knee pain likely related to the nature of his work and his history of having changes to his ankles, surgeries.  Recommended conservative management, RICE method, naproxen for pain and inflammation.  I provided him with information to Dr.  Mort Sawyers office with Ortho care for a consultation regarding persistent joint pains. Counseled patient on potential for adverse effects with medications prescribed/recommended today, ER and return-to-clinic precautions discussed, patient verbalized understanding. ? ?  ?Wallis Bamberg, PA-C ?12/13/21 1237 ? ?

## 2021-12-21 ENCOUNTER — Ambulatory Visit
Admission: EM | Admit: 2021-12-21 | Discharge: 2021-12-21 | Disposition: A | Discharge status: Home / Self Care | Payer: Managed Care, Other (non HMO) | Attending: Family Medicine | Admitting: Family Medicine

## 2021-12-21 ENCOUNTER — Other Ambulatory Visit: Payer: Self-pay

## 2021-12-21 DIAGNOSIS — R42 Dizziness and giddiness: Secondary | ICD-10-CM

## 2021-12-21 LAB — POCT URINALYSIS DIP (MANUAL ENTRY)
Bilirubin, UA: NEGATIVE
Blood, UA: NEGATIVE
Glucose, UA: NEGATIVE mg/dL
Ketones, POC UA: NEGATIVE mg/dL
Leukocytes, UA: NEGATIVE
Nitrite, UA: NEGATIVE
Protein Ur, POC: NEGATIVE mg/dL
Spec Grav, UA: <=1.005 — AB (ref 1.010–1.025)
Urobilinogen, UA: 0.2 E.U./dL
pH, UA: 6 (ref 5.0–8.0)

## 2021-12-21 NOTE — ED Provider Notes (Signed)
?[facility] ? ? ? ?CSN: [id number] ?Arrival date & time: 12/21/21  1221 ? ? ?  ? ?History   ?Chief Complaint ?Chief Complaint  ?Patient presents with  ? Dizziness  ? ? ?HPI ?Stephen Meyer is a 21 y.o. male.  ? ?Presenting today with 4-day history of lightheadedness that waxes and wanes, worse with position changes like squatting to standing.  He states he feels somewhat dehydrated despite drinking water and Gatorade frequently the past few days.  He has had multiple episodes of this in the past several years, has been to the emergency department and primary care for these issues and work-ups have always been negative.  He states that this has been happening at work and they wanted him to get checked out.  Not tried any medications over-the-counter for symptoms.  No known history of cardiac or neurologic issues. ? ? ? ?Past Medical History:  ?Diagnosis Date  ? Asthma   ? ?Patient Active Problem List  ? Diagnosis Date Noted  ? Verruca vulgaris 01/21/2013  ? Obesity, unspecified 01/21/2013  ? Exercise-induced asthma 01/21/2013  ? Foster care (status) 01/21/2013  ? ? ?Past Surgical History:  ?Procedure Laterality Date  ? JOINT REPLACEMENT    ? ankle sugery  ? ? ? ?Home Medications   ? ?Prior to Admission medications   ?Medication Sig Start Date End Date Taking? Authorizing Provider  ?naproxen (NAPROSYN) 375 MG tablet Take 1 tablet (375 mg total) by mouth 2 (two) times daily with a meal. 12/13/21   Wallis Bamberg, PA-C  ?ondansetron (ZOFRAN-ODT) 4 MG disintegrating tablet Take 1 tablet (4 mg total) by mouth every 8 (eight) hours as needed for nausea or vomiting. 11/23/21   Particia Nearing, PA-C  ? ?Family History ?Family History  ?Adopted: Yes  ?Problem Relation Age of Onset  ? Prostate cancer Father   ? Cancer Father   ? Diabetes Maternal Grandmother   ? Diabetes Paternal Grandmother   ? ?Social History ?Social History  ? ?Tobacco Use  ? Smoking status: Never  ? Smokeless tobacco: Never  ?Vaping Use  ?  Vaping Use: Some days  ? Substances: Nicotine, Flavoring  ?Substance Use Topics  ? Alcohol use: Yes  ?  Comment: occasionally  ? Drug use: Yes  ?  Types: Marijuana  ? ? ? ?Allergies   ?Patient has no known allergies. ? ? ?Review of Systems ?Review of Systems ?Per HPI ? ?Physical Exam ?Triage Vital Signs ?ED Triage Vitals  ?Enc Vitals Group  ?   BP 12/21/21 1352 130/80  ?   Pulse Rate 12/21/21 1352 (!) 102  ?   Resp 12/21/21 1352 20  ?   Temp 12/21/21 1352 99.1 ?F (37.3 ?C)  ?   Temp Source 12/21/21 1352 Oral  ?   SpO2 12/21/21 1352 98 %  ?   Weight --   ?   Height --   ?   Head Circumference --   ?   Peak Flow --   ?   Pain Score 12/21/21 1354 0  ?   Pain Loc --   ?   Pain Edu? --   ?   Excl. in GC? --   ? ?Orthostatic VS for the past 24 hrs: ? BP- Lying Pulse- Lying BP- Sitting Pulse- Sitting BP- Standing at 0 minutes Pulse- Standing at 0 minutes  ?12/21/21 1425 115/72 73 117/77 77 128/87 97  ? ? ?Updated Vital Signs ?BP 130/80 (BP Location: Right Arm)  Pulse (!) 102   Temp 99.1 ?F (37.3 ?C) (Oral)   Resp 20   SpO2 98%  ? ?Visual Acuity ?Right Eye Distance:   ?Left Eye Distance:   ?Bilateral Distance:   ? ?Right Eye Near:   ?Left Eye Near:    ?Bilateral Near:    ? ?Physical Exam ?Vitals and nursing note reviewed.  ?Constitutional:   ?   Appearance: Normal appearance.  ?HENT:  ?   Head: Atraumatic.  ?   Mouth/Throat:  ?   Mouth: Mucous membranes are moist.  ?Eyes:  ?   Extraocular Movements: Extraocular movements intact.  ?   Conjunctiva/sclera: Conjunctivae normal.  ?   Pupils: Pupils are equal, round, and reactive to light.  ?Cardiovascular:  ?   Rate and Rhythm: Normal rate and regular rhythm.  ?Pulmonary:  ?   Effort: Pulmonary effort is normal.  ?   Breath sounds: Normal breath sounds.  ?Musculoskeletal:     ?   General: Normal range of motion.  ?   Cervical back: Normal range of motion and neck supple.  ?Skin: ?   General: Skin is warm and dry.  ?Neurological:  ?   General: No focal deficit present.  ?    Mental Status: He is oriented to person, place, and time.  ?   Cranial Nerves: No cranial nerve deficit.  ?   Motor: No weakness.  ?   Gait: Gait normal.  ?Psychiatric:     ?   Mood and Affect: Mood normal.     ?   Thought Content: Thought content normal.     ?   Judgment: Judgment normal.  ? ? ? ?UC Treatments / Results  ?Labs ?(all labs ordered are listed, but only abnormal results are displayed) ?Labs Reviewed  ?POCT URINALYSIS DIP (MANUAL ENTRY) - Abnormal; Notable for the following components:  ?    Result Value  ? Color, UA light yellow (*)   ? Spec Grav, UA <=1.005 (*)   ? All other components within normal limits  ? ? ?EKG ? ? ?Radiology ?No results found. ? ?Procedures ?Procedures (including critical care time) ? ?Medications Ordered in UC ?Medications - No data to display ? ?Initial Impression / Assessment and Plan / UC Course  ?I have reviewed the triage vital signs and the nursing notes. ? ?Pertinent labs & imaging results that were available during my care of the patient were reviewed by me and considered in my medical decision making (see chart for details). ? ?  ? ?Normally tachycardic in triage, otherwise vital signs benign and reassuring.  Exam reassuring with no focal deficits.  UA also very reassuring, mildly elevated specific gravity and discussed to continue pushing fluids.  Orthostatic vital signs, EKG benign today.  EKG showing normal sinus rhythm at 63 bpm with no acute ST or T wave changes.  He declines blood work or CBG today.  Discussed to follow-up with PCP given the chronicity of this issue for further evaluation.  Work note given.  ED for worsening symptoms. ? ?Final Clinical Impressions(s) / UC Diagnoses  ? ?Final diagnoses:  ?Light headedness  ? ? ? ?Discharge Instructions   ? ?  ?Follow up with Primary Care as soon as able ? ? ? ?ED Prescriptions   ?None ?  ? ?PDMP not reviewed this encounter. ?  ?Volney American, PA-C ?12/21/21 1602 ? ?

## 2021-12-21 NOTE — Discharge Instructions (Signed)
Follow up with Primary Care as soon as able ?

## 2021-12-21 NOTE — ED Triage Notes (Signed)
Pt states that this past Monday he had a headache and he squatted at work and stood back up and he was lightheaded ? ?Pt states he went back in the next day and he squatted again and felt lightheaded again ? ?Pt states the next day he felt very dehydrated ? ?Pt states he has tried drinking lots water and Gatorade without any relief ? ?Pt states he does not like to take meds ? ? ?

## 2021-12-29 ENCOUNTER — Encounter: Payer: Self-pay | Admitting: Emergency Medicine

## 2021-12-29 ENCOUNTER — Ambulatory Visit
Admission: EM | Admit: 2021-12-29 | Discharge: 2021-12-29 | Disposition: A | Discharge status: Home / Self Care | Payer: Managed Care, Other (non HMO) | Attending: Family Medicine | Admitting: Family Medicine

## 2021-12-29 DIAGNOSIS — R112 Nausea with vomiting, unspecified: Secondary | ICD-10-CM

## 2021-12-29 MED ORDER — ONDANSETRON 8 MG PO TBDP
8.0000 mg | ORAL_TABLET | Freq: Once | ORAL | Status: AC
  Administered 2021-12-29: 8 mg via ORAL

## 2021-12-29 MED ORDER — ONDANSETRON 4 MG PO TBDP: 4.0000 mg | ORAL_TABLET | Freq: Three times a day (TID) | ORAL | 0 refills | Status: AC | PRN

## 2021-12-29 NOTE — ED Triage Notes (Signed)
Vomiting yesterday and this morning.  States he feels nauseated. ?

## 2021-12-29 NOTE — ED Provider Notes (Signed)
?[facility] ? ? ? ?CSN: [id number] ?Arrival date & time: 12/29/21  4034 ? ? ?  ? ?History   ?Chief Complaint ?No chief complaint on file. ? ? ?HPI ?Stephen Meyer is a 21 y.o. male.  ? ?Presenting today with 1 day history of nausea, vomiting.  Denies abdominal pain, diarrhea, constipation, fever, chills, body aches.  Multiple family members sick with similar symptoms.  Not trying anything over-the-counter for symptoms.  Has not been trying to eat or drink so far today so unsure if tolerating p.o. ? ? ?Past Medical History:  ?Diagnosis Date  ? Asthma   ? ? ?Patient Active Problem List  ? Diagnosis Date Noted  ? Verruca vulgaris 01/21/2013  ? Obesity, unspecified 01/21/2013  ? Exercise-induced asthma 01/21/2013  ? Foster care (status) 01/21/2013  ? ? ?Past Surgical History:  ?Procedure Laterality Date  ? JOINT REPLACEMENT    ? ankle sugery  ? ? ? ? ? ?Home Medications   ? ?Prior to Admission medications   ?Medication Sig Start Date End Date Taking? Authorizing Provider  ?naproxen (NAPROSYN) 375 MG tablet Take 1 tablet (375 mg total) by mouth 2 (two) times daily with a meal. 12/13/21   Wallis Bamberg, PA-C  ?ondansetron (ZOFRAN-ODT) 4 MG disintegrating tablet Take 1 tablet (4 mg total) by mouth every 8 (eight) hours as needed for nausea or vomiting. 12/29/21   Particia Nearing, PA-C  ? ? ?Family History ?Family History  ?Adopted: Yes  ?Problem Relation Age of Onset  ? Prostate cancer Father   ? Cancer Father   ? Diabetes Maternal Grandmother   ? Diabetes Paternal Grandmother   ? ? ?Social History ?Social History  ? ?Tobacco Use  ? Smoking status: Never  ? Smokeless tobacco: Never  ?Vaping Use  ? Vaping Use: Some days  ? Substances: Nicotine, Flavoring  ?Substance Use Topics  ? Alcohol use: Yes  ?  Comment: occasionally  ? Drug use: Yes  ?  Types: Marijuana  ? ? ? ?Allergies   ?Patient has no known allergies. ? ? ?Review of Systems ?Review of Systems ?Per HPI ? ?Physical Exam ?Triage Vital Signs ?ED Triage  Vitals  ?Enc Vitals Group  ?   BP 12/29/21 1040 138/78  ?   Pulse Rate 12/29/21 1040 100  ?   Resp 12/29/21 1040 18  ?   Temp 12/29/21 1040 98.9 ?F (37.2 ?C)  ?   Temp Source 12/29/21 1040 Oral  ?   SpO2 12/29/21 1040 98 %  ?   Weight --   ?   Height --   ?   Head Circumference --   ?   Peak Flow --   ?   Pain Score 12/29/21 1041 0  ?   Pain Loc --   ?   Pain Edu? --   ?   Excl. in GC? --   ? ?No data found. ? ?Updated Vital Signs ?BP 138/78 (BP Location: Right Arm)   Pulse 100   Temp 98.9 ?F (37.2 ?C) (Oral)   Resp 18   SpO2 98%  ? ?Visual Acuity ?Right Eye Distance:   ?Left Eye Distance:   ?Bilateral Distance:   ? ?Right Eye Near:   ?Left Eye Near:    ?Bilateral Near:    ? ?Physical Exam ?Vitals and nursing note reviewed.  ?Constitutional:   ?   Appearance: Normal appearance.  ?HENT:  ?   Head: Atraumatic.  ?   Mouth/Throat:  ?  Mouth: Mucous membranes are moist.  ?Eyes:  ?   Extraocular Movements: Extraocular movements intact.  ?   Conjunctiva/sclera: Conjunctivae normal.  ?Cardiovascular:  ?   Rate and Rhythm: Normal rate and regular rhythm.  ?Pulmonary:  ?   Effort: Pulmonary effort is normal.  ?   Breath sounds: Normal breath sounds.  ?Abdominal:  ?   General: Bowel sounds are normal. There is no distension.  ?   Palpations: Abdomen is soft.  ?   Tenderness: There is no abdominal tenderness. There is no guarding.  ?Musculoskeletal:     ?   General: Normal range of motion.  ?   Cervical back: Normal range of motion and neck supple.  ?Skin: ?   General: Skin is warm and dry.  ?Neurological:  ?   General: No focal deficit present.  ?   Mental Status: He is oriented to person, place, and time.  ?Psychiatric:     ?   Mood and Affect: Mood normal.     ?   Thought Content: Thought content normal.     ?   Judgment: Judgment normal.  ? ? ? ?UC Treatments / Results  ?Labs ?(all labs ordered are listed, but only abnormal results are displayed) ?Labs Reviewed - No data to display ? ?EKG ? ? ?Radiology ?No results  found. ? ?Procedures ?Procedures (including critical care time) ? ?Medications Ordered in UC ?Medications  ?ondansetron (ZOFRAN-ODT) disintegrating tablet 8 mg (8 mg Oral Given 12/29/21 1113)  ? ? ?Initial Impression / Assessment and Plan / UC Course  ?I have reviewed the triage vital signs and the nursing notes. ? ?Pertinent labs & imaging results that were available during my care of the patient were reviewed by me and considered in my medical decision making (see chart for details). ? ?  ? ?Vitals and exam overall reassuring, suspect viral GI illness.  Zofran administered in clinic and supply given for as needed use at home.  Brat diet, push fluids.  Return for worsening symptoms. ? ?Final Clinical Impressions(s) / UC Diagnoses  ? ?Final diagnoses:  ?Nausea and vomiting, unspecified vomiting type  ? ?Discharge Instructions   ?None ?  ? ?ED Prescriptions   ? ? Medication Sig Dispense Auth. Provider  ? ondansetron (ZOFRAN-ODT) 4 MG disintegrating tablet Take 1 tablet (4 mg total) by mouth every 8 (eight) hours as needed for nausea or vomiting. 20 tablet Particia Nearing, [place]  ? ?  ? ?PDMP not reviewed this encounter. ?  ?Particia Nearing, PA-C ?12/29/21 1115 ? ?

## 2022-01-01 ENCOUNTER — Ambulatory Visit
Admission: EM | Admit: 2022-01-01 | Discharge: 2022-01-01 | Disposition: A | Discharge status: Home / Self Care | Payer: Managed Care, Other (non HMO)

## 2022-01-01 DIAGNOSIS — R112 Nausea with vomiting, unspecified: Secondary | ICD-10-CM

## 2022-01-01 NOTE — ED Provider Notes (Signed)
?[facility] ? ? ? ?CSN: [id number] ?Arrival date & time: 01/01/22  0945 ? ? ?  ? ?History   ?Chief Complaint ?Chief Complaint  ?Patient presents with  ? Emesis  ? ? ?HPI ?Stephen Meyer is a 21 y.o. male.  ? ?The patient is a 21 year old male who presents for nausea and vomiting.  Patient states he had 2 episodes of vomiting this morning.  He was seen here in the clinic on 4/21, he was given antiemetic in the office and given a prescription.  He states over the weekend he remained nauseated, but did not vomit.  He states this morning he vomited twice.  He states he "felt like he had to "force it out".  He states the emesis was "thick".  States that he did eat chicken tenders yesterday.  He denies fever, chills, or urinary symptoms.  He states he does have abdominal pain associated with the episodes of vomiting.  His last bowel movement was today, but he states he had to "force "it out.  He has not started the medication for nausea that was previously prescribed.  He denies any new symptoms. ? ?The history is provided by the patient.  ? ?Past Medical History:  ?Diagnosis Date  ? Asthma   ? ? ?Patient Active Problem List  ? Diagnosis Date Noted  ? Verruca vulgaris 01/21/2013  ? Obesity, unspecified 01/21/2013  ? Exercise-induced asthma 01/21/2013  ? Foster care (status) 01/21/2013  ? ? ?Past Surgical History:  ?Procedure Laterality Date  ? JOINT REPLACEMENT    ? ankle sugery  ? ? ? ? ? ?Home Medications   ? ?Prior to Admission medications   ?Medication Sig Start Date End Date Taking? Authorizing Provider  ?naproxen (NAPROSYN) 375 MG tablet Take 1 tablet (375 mg total) by mouth 2 (two) times daily with a meal. 12/13/21   Wallis Bamberg, PA-C  ?ondansetron (ZOFRAN-ODT) 4 MG disintegrating tablet Take 1 tablet (4 mg total) by mouth every 8 (eight) hours as needed for nausea or vomiting. 12/29/21   Particia Nearing, PA-C  ? ? ?Family History ?Family History  ?Adopted: Yes  ?Problem Relation Age of Onset  ?  Prostate cancer Father   ? Cancer Father   ? Diabetes Maternal Grandmother   ? Diabetes Paternal Grandmother   ? ? ?Social History ?Social History  ? ?Tobacco Use  ? Smoking status: Never  ? Smokeless tobacco: Never  ?Vaping Use  ? Vaping Use: Some days  ? Substances: Nicotine, Flavoring  ?Substance Use Topics  ? Alcohol use: Yes  ?  Comment: occasionally  ? Drug use: Not Currently  ?  Types: Marijuana  ? ? ? ?Allergies   ?Patient has no known allergies. ? ? ?Review of Systems ?Review of Systems  ?Constitutional:  Positive for appetite change. Negative for activity change, fatigue and fever.  ?HENT: Negative.    ?Respiratory: Negative.    ?Cardiovascular: Negative.   ?Gastrointestinal:  Positive for constipation, nausea and vomiting. Negative for diarrhea.  ?Skin: Negative.   ?Psychiatric/Behavioral: Negative.    ? ? ?Physical Exam ?Triage Vital Signs ?ED Triage Vitals  ?Enc Vitals Group  ?   BP 01/01/22 1108 107/71  ?   Pulse Rate 01/01/22 1108 60  ?   Resp 01/01/22 1108 16  ?   Temp 01/01/22 1108 98.7 ?F (37.1 ?C)  ?   Temp Source 01/01/22 1108 Oral  ?   SpO2 01/01/22 1108 98 %  ?   Weight --   ?  Height --   ?   Head Circumference --   ?   Peak Flow --   ?   Pain Score 01/01/22 1107 0  ?   Pain Loc --   ?   Pain Edu? --   ?   Excl. in GC? --   ? ?No data found. ? ?Updated Vital Signs ?BP 107/71 (BP Location: Right Arm)   Pulse 60   Temp 98.7 ?F (37.1 ?C) (Oral)   Resp 16   SpO2 98%  ? ?Visual Acuity ?Right Eye Distance:   ?Left Eye Distance:   ?Bilateral Distance:   ? ?Right Eye Near:   ?Left Eye Near:    ?Bilateral Near:    ? ?Physical Exam ?Vitals reviewed.  ?Constitutional:   ?   General: He is not in acute distress. ?   Appearance: Normal appearance.  ?HENT:  ?   Head: Normocephalic and atraumatic.  ?   Right Ear: Tympanic membrane, ear canal and external ear normal.  ?   Left Ear: Tympanic membrane, ear canal and external ear normal.  ?   Nose: Nose normal.  ?Eyes:  ?   Extraocular Movements:  Extraocular movements intact.  ?   Pupils: Pupils are equal, round, and reactive to light.  ?Cardiovascular:  ?   Rate and Rhythm: Normal rate and regular rhythm.  ?   Pulses: Normal pulses.  ?   Heart sounds: Normal heart sounds.  ?Pulmonary:  ?   Effort: Pulmonary effort is normal.  ?   Breath sounds: Normal breath sounds.  ?Abdominal:  ?   General: Bowel sounds are normal.  ?   Palpations: Abdomen is soft.  ?   Tenderness: There is no abdominal tenderness.  ?Musculoskeletal:  ?   Cervical back: Normal range of motion.  ?Skin: ?   General: Skin is warm and dry.  ?Neurological:  ?   General: No focal deficit present.  ?   Mental Status: He is alert and oriented to person, place, and time.  ?Psychiatric:     ?   Mood and Affect: Mood normal.     ?   Behavior: Behavior normal.  ? ? ? ?UC Treatments / Results  ?Labs ?(all labs ordered are listed, but only abnormal results are displayed) ?Labs Reviewed - No data to display ? ?EKG ? ? ?Radiology ?No results found. ? ?Procedures ?Procedures (including critical care time) ? ?Medications Ordered in UC ?Medications - No data to display ? ?Initial Impression / Assessment and Plan / UC Course  ?I have reviewed the triage vital signs and the nursing notes. ? ?Pertinent labs & imaging results that were available during my care of the patient were reviewed by me and considered in my medical decision making (see chart for details). ? ?The patient is a 21 year old male who presents for nausea and vomiting.  Patient was seen on 4/21, for the same or similar symptoms.  On exam, his vitals are stable, he is in no acute distress, he does not have abdominal tenderness.  Symptoms are consistent with a viral gastroenteritis.  Patient was advised to start the Zofran he was previously prescribed, also recommended a brat diet.  Patient advised to increase fluids and get plenty of rest.  Follow-up as needed. ?Final Clinical Impressions(s) / UC Diagnoses  ? ?Final diagnoses:  ?None   ? ?Discharge Instructions   ?None ?  ? ?ED Prescriptions   ?None ?  ? ?PDMP not reviewed this encounter. ?  ?Valma Rotenberg-Warren,  Sadie Haberhristie J, NP ?01/01/22 1122 ? ?

## 2022-01-01 NOTE — Discharge Instructions (Signed)
Go pick up the prescription of Zofran that you were previously ordered. ?Recommend a brat diet while symptoms persist.  This includes bananas, rice, applesauce, and toast. ?Increase fluids. ?Increase the fiber in your diet to help with the constipation. ?Follow-up as needed. ?

## 2022-01-01 NOTE — ED Triage Notes (Signed)
Pt reports he ws seen on 12/29/21 as he was vomiting and just felt nauseous during the weekend., Pt reports he vomited 2 times today in a 10 min period when he woke up.  ?

## 2022-01-10 ENCOUNTER — Ambulatory Visit (INDEPENDENT_AMBULATORY_CARE_PROVIDER_SITE_OTHER): Payer: Managed Care, Other (non HMO)

## 2022-01-10 ENCOUNTER — Ambulatory Visit
Admission: EM | Admit: 2022-01-10 | Discharge: 2022-01-10 | Disposition: A | Discharge status: Home / Self Care | Payer: Managed Care, Other (non HMO) | Attending: Nurse Practitioner | Admitting: Nurse Practitioner

## 2022-01-10 DIAGNOSIS — S29011A Strain of muscle and tendon of front wall of thorax, initial encounter: Secondary | ICD-10-CM

## 2022-01-10 DIAGNOSIS — R0782 Intercostal pain: Secondary | ICD-10-CM

## 2022-01-10 DIAGNOSIS — S20211A Contusion of right front wall of thorax, initial encounter: Secondary | ICD-10-CM

## 2022-01-10 NOTE — Discharge Instructions (Addendum)
Your x-ray is negative for a rib fracture.  Your symptoms are consistent with a rib contusion based on the fall and a muscle strain. ?We will prescribe ibuprofen to help with your pain.  This will also help with inflammation.  Take as directed. ?Recommend using ice or heat.  Apply for 20 minutes, remove for 1 hour, then repeat. ?Continue to be as active as possible.  This includes stretching, which should help decrease her recovery time. ?Follow-up immediately if you develop shortness of breath, difficulty breathing, or other concerns. ?

## 2022-01-10 NOTE — ED Provider Notes (Signed)
?[facility] ? ? ? ?CSN: [id number] ?Arrival date & time: 01/10/22  0816 ? ? ?  ? ?History   ?Chief Complaint ?Chief Complaint  ?Patient presents with  ? Rib Injury  ?  right  ? ? ?HPI ?Stephen Meyer is a 21 y.o. male.  ? ?The patient is a 21 year old male who presents for right-sided chest pain.  Patient states that he fell onto a metal bar on a machine at work 2 days ago.  Since that time, he has had tenderness to his right rib cage.  Patient also reports that he has had intermittent "pulling pain" to the right rib cage.  He states that he notices the pain more after he does increased activity throughout the day.  He denies any fever, chills, cough, shortness of breath, difficulty breathing, or worsening of pain with coughing, sneezing, or deep breathing.  Patient states that he has not taken any medication for his pain or use any intervention such as ice or heat. ? ?The history is provided by the patient.  ? ?Past Medical History:  ?Diagnosis Date  ? Asthma   ? ? ?Patient Active Problem List  ? Diagnosis Date Noted  ? Verruca vulgaris 01/21/2013  ? Obesity, unspecified 01/21/2013  ? Exercise-induced asthma 01/21/2013  ? Foster care (status) 01/21/2013  ? ? ?Past Surgical History:  ?Procedure Laterality Date  ? JOINT REPLACEMENT    ? ankle sugery  ? ? ? ? ? ?Home Medications   ? ?Prior to Admission medications   ?Medication Sig Start Date End Date Taking? Authorizing Provider  ?naproxen (NAPROSYN) 375 MG tablet Take 1 tablet (375 mg total) by mouth 2 (two) times daily with a meal. 12/13/21   Wallis Bamberg, PA-C  ?ondansetron (ZOFRAN-ODT) 4 MG disintegrating tablet Take 1 tablet (4 mg total) by mouth every 8 (eight) hours as needed for nausea or vomiting. 12/29/21   Particia Nearing, PA-C  ? ? ?Family History ?Family History  ?Adopted: Yes  ?Problem Relation Age of Onset  ? Prostate cancer Father   ? Cancer Father   ? Diabetes Maternal Grandmother   ? Diabetes Paternal Grandmother   ? ? ?Social  History ?Social History  ? ?Tobacco Use  ? Smoking status: Never  ? Smokeless tobacco: Never  ?Vaping Use  ? Vaping Use: Some days  ? Substances: Nicotine, Flavoring  ?Substance Use Topics  ? Alcohol use: Yes  ?  Comment: occasionally  ? Drug use: Not Currently  ?  Types: Marijuana  ? ? ? ?Allergies   ?Patient has no known allergies. ? ? ?Review of Systems ?Review of Systems  ?Constitutional: Negative.   ?Respiratory:  Negative for shortness of breath and wheezing.   ?Cardiovascular:  Positive for chest pain (right ribcage).  ?Gastrointestinal: Negative.   ?Musculoskeletal: Negative.   ?Skin: Negative.   ?Psychiatric/Behavioral: Negative.    ? ? ?Physical Exam ?Triage Vital Signs ?ED Triage Vitals  ?Enc Vitals Group  ?   BP 01/10/22 0828 124/75  ?   Pulse Rate 01/10/22 0828 68  ?   Resp 01/10/22 0828 17  ?   Temp 01/10/22 0828 97.7 ?F (36.5 ?C)  ?   Temp Source 01/10/22 0828 Oral  ?   SpO2 01/10/22 0828 98 %  ?   Weight --   ?   Height --   ?   Head Circumference --   ?   Peak Flow --   ?   Pain Score 01/10/22 0827 0  ?  Pain Loc --   ?   Pain Edu? --   ?   Excl. in GC? --   ? ?No data found. ? ?Updated Vital Signs ?BP 124/75 (BP Location: Right Arm)   Pulse 68   Temp 97.7 ?F (36.5 ?C) (Oral)   Resp 17   SpO2 98%  ? ?Visual Acuity ?Right Eye Distance:   ?Left Eye Distance:   ?Bilateral Distance:   ? ?Right Eye Near:   ?Left Eye Near:    ?Bilateral Near:    ? ?Physical Exam ?Vitals and nursing note reviewed.  ?Constitutional:   ?   General: He is not in acute distress. ?   Appearance: He is well-developed.  ?HENT:  ?   Head: Normocephalic and atraumatic.  ?Eyes:  ?   Conjunctiva/sclera: Conjunctivae normal.  ?Cardiovascular:  ?   Rate and Rhythm: Normal rate and regular rhythm.  ?   Heart sounds: No murmur heard. ?Pulmonary:  ?   Effort: Pulmonary effort is normal. No respiratory distress.  ?   Breath sounds: Normal breath sounds. No wheezing or rales.  ?Chest:  ?   Chest wall: Tenderness (right ribcage, ribs  7-8) present.  ?Abdominal:  ?   Palpations: Abdomen is soft.  ?   Tenderness: There is no abdominal tenderness.  ?Musculoskeletal:     ?   General: No swelling.  ?   Cervical back: Neck supple.  ?Skin: ?   General: Skin is warm and dry.  ?   Capillary Refill: Capillary refill takes less than 2 seconds.  ?Neurological:  ?   General: No focal deficit present.  ?   Mental Status: He is alert and oriented to person, place, and time.  ?Psychiatric:     ?   Mood and Affect: Mood normal.  ? ? ? ?UC Treatments / Results  ?Labs ?(all labs ordered are listed, but only abnormal results are displayed) ?Labs Reviewed - No data to display ? ?EKG ? ? ?Radiology ?DG Ribs Unilateral W/Chest Right ? ?Result Date: 01/10/2022 ?CLINICAL DATA:  Patient fell 2 days ago on his right side. EXAM: RIGHT RIBS AND CHEST - 3+ VIEW COMPARISON:  None Available. FINDINGS: The lungs are clear without focal pneumonia, edema, pneumothorax or pleural effusion. The cardiopericardial silhouette is within normal limits for size. Oblique views of the right ribs show no evidence for an acute displaced right-sided rib fracture IMPRESSION: Negative. Electronically Signed   By: Kennith Center M.D.   On: 01/10/2022 08:51   ? ?Procedures ?Procedures (including critical care time) ? ?Medications Ordered in UC ?Medications - No data to display ? ?Initial Impression / Assessment and Plan / UC Course  ?I have reviewed the triage vital signs and the nursing notes. ? ?Pertinent labs & imaging results that were available during my care of the patient were reviewed by me and considered in my medical decision making (see chart for details). ? ?The patient is a 21 year old male who presents with right-sided rib pain.  Symptoms started approximately 2 days ago after he fell onto a metal bar when at work.  During his exam, he also provides that he has been having intermittent "pulling" pain in the right side of his chest.  He denies worsening pain with coughing, deep  breathing, or sneezing.  On exam, the patient does have a pinpoint area of tenderness and tenderness along ribs 7 and 8.  His x-rays are negative for rib fracture.  Based on his mechanism of injury and recent  fall, it appears he most likely has a rib contusion.  Cannot determine what exacerbated the underlying issue of the "pulling" in his ribs, but that symptoms could be consistent with a muscle strain.  Patient was advised of the same.  Patient advised to use over-the-counter pain medicine to help with his pain.  Also advised the use of ice or heat, apply for 20 minutes, remove for 1 hour, then repeat. Strict return precautions were provided to the patient.  Follow-up as needed. ?Final Clinical Impressions(s) / UC Diagnoses  ? ?Final diagnoses:  ?Muscle strain of chest wall, initial encounter  ?Rib contusion, right, initial encounter  ? ? ? ?Discharge Instructions   ? ?  ?Your x-ray is negative for a rib fracture.  Your symptoms are consistent with a rib contusion based on the fall and a muscle strain. ?We will prescribe ibuprofen to help with your pain.  This will also help with inflammation.  Take as directed. ?Recommend using ice or heat.  Apply for 20 minutes, remove for 1 hour, then repeat. ?Continue to be as active as possible.  This includes stretching, which should help decrease her recovery time. ?Follow-up immediately if you develop shortness of breath, difficulty breathing, or other concerns. ? ? ? ? ?ED Prescriptions   ?None ?  ? ?PDMP not reviewed this encounter. ?  ?Abran CantorLeath-Warren, Deryck Hippler J, NP ?01/10/22 0912 ? ?

## 2022-01-10 NOTE — ED Triage Notes (Signed)
2 day h/o right sided intermittent rib pain. Pt reports he tripped at work and caught himself with a metal bar. He notes that his right side banged against the bar when breaking the fall causing a gradual onset of pain. No bruising or swelling. ?Pain with raising his right arm above his head. Pt is suspicious for a pulled muscle. No meds taken. ?

## 2022-08-11 IMAGING — DX DG RIBS W/ CHEST 3+V*R*
3 series · 3 of 3 positions shown · non-contrast
Comparison: None Available.

CLINICAL DATA: Patient fell 2 days ago on his right side.

EXAM:
RIGHT RIBS AND CHEST - 3+ VIEW

[chest pa]
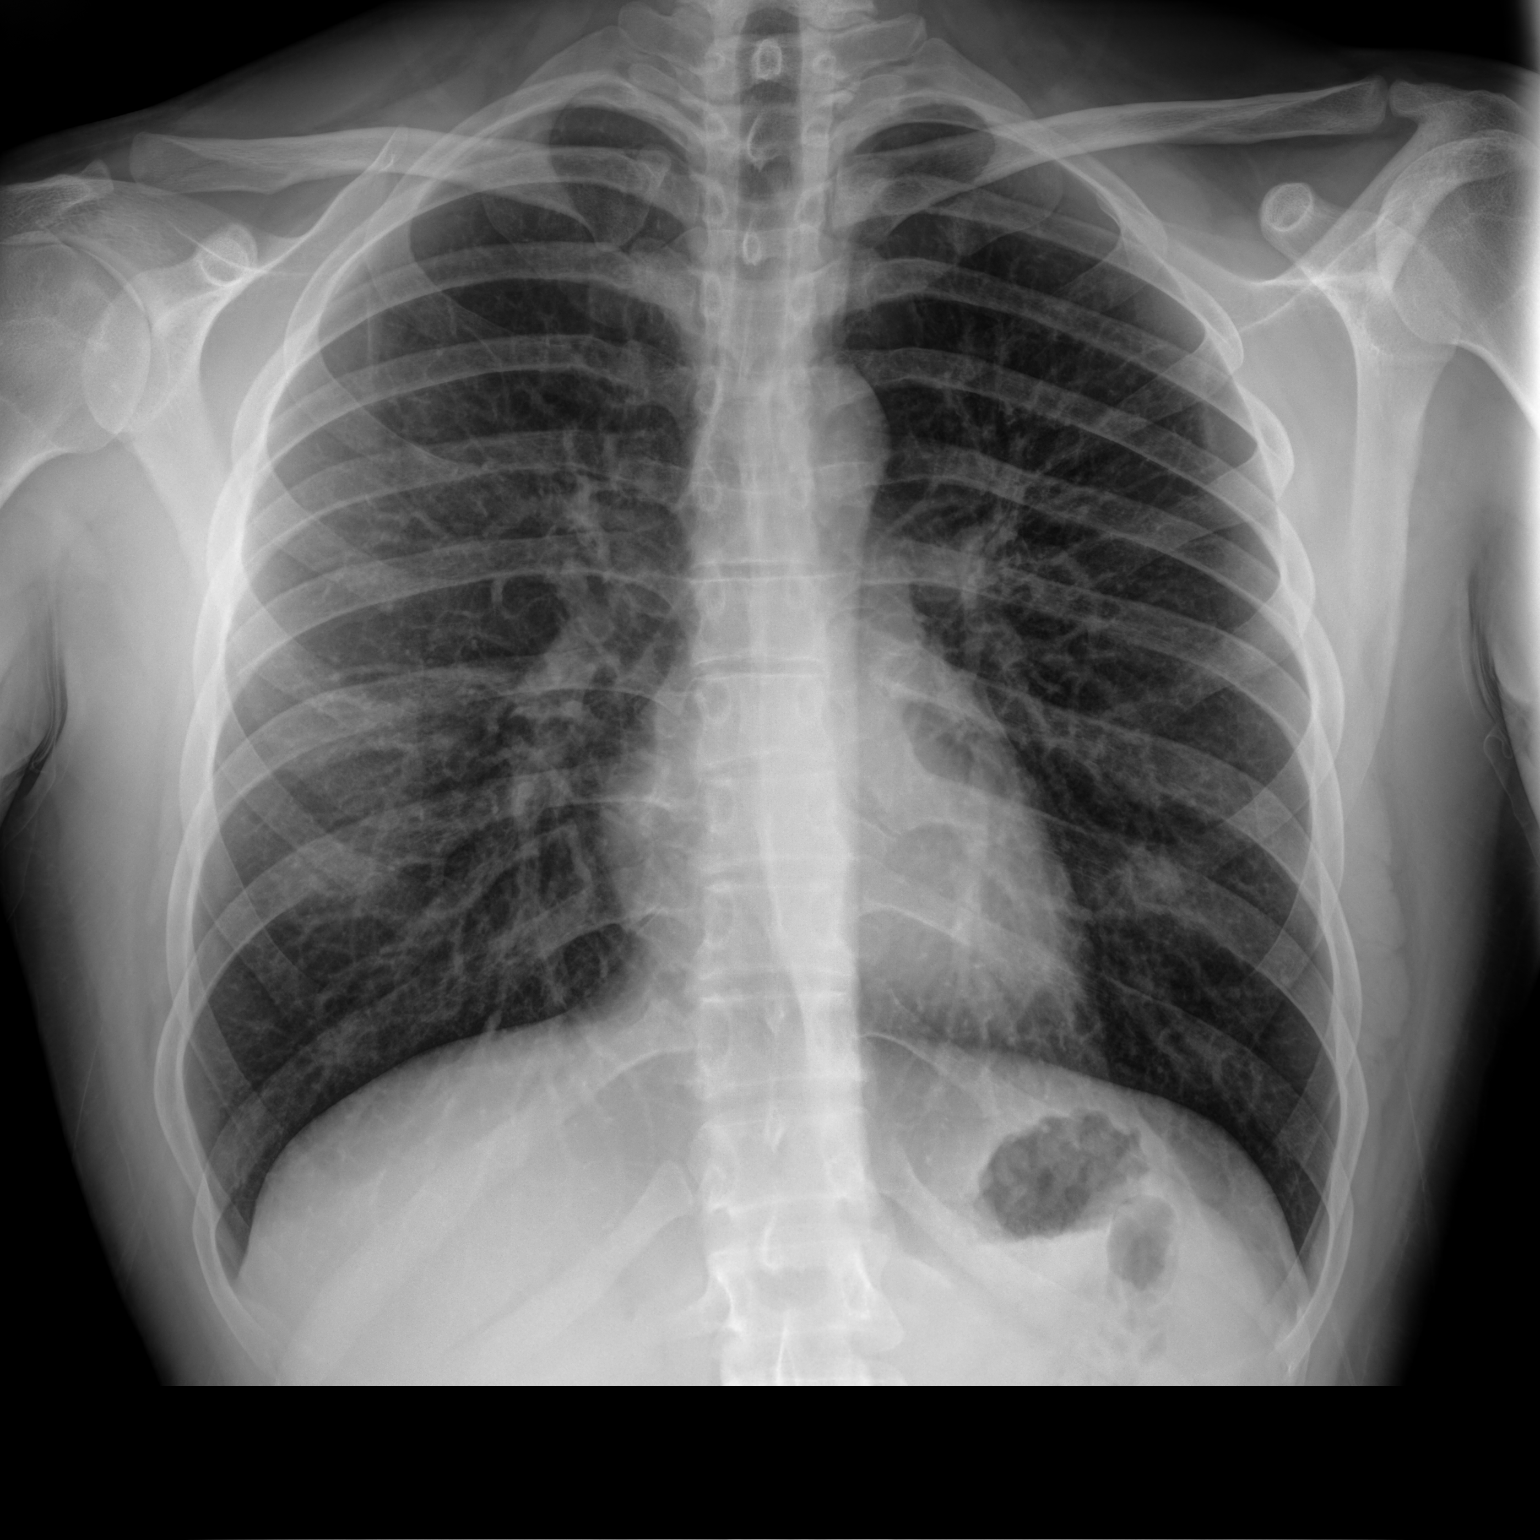

[hemithorax (ribs) ap]
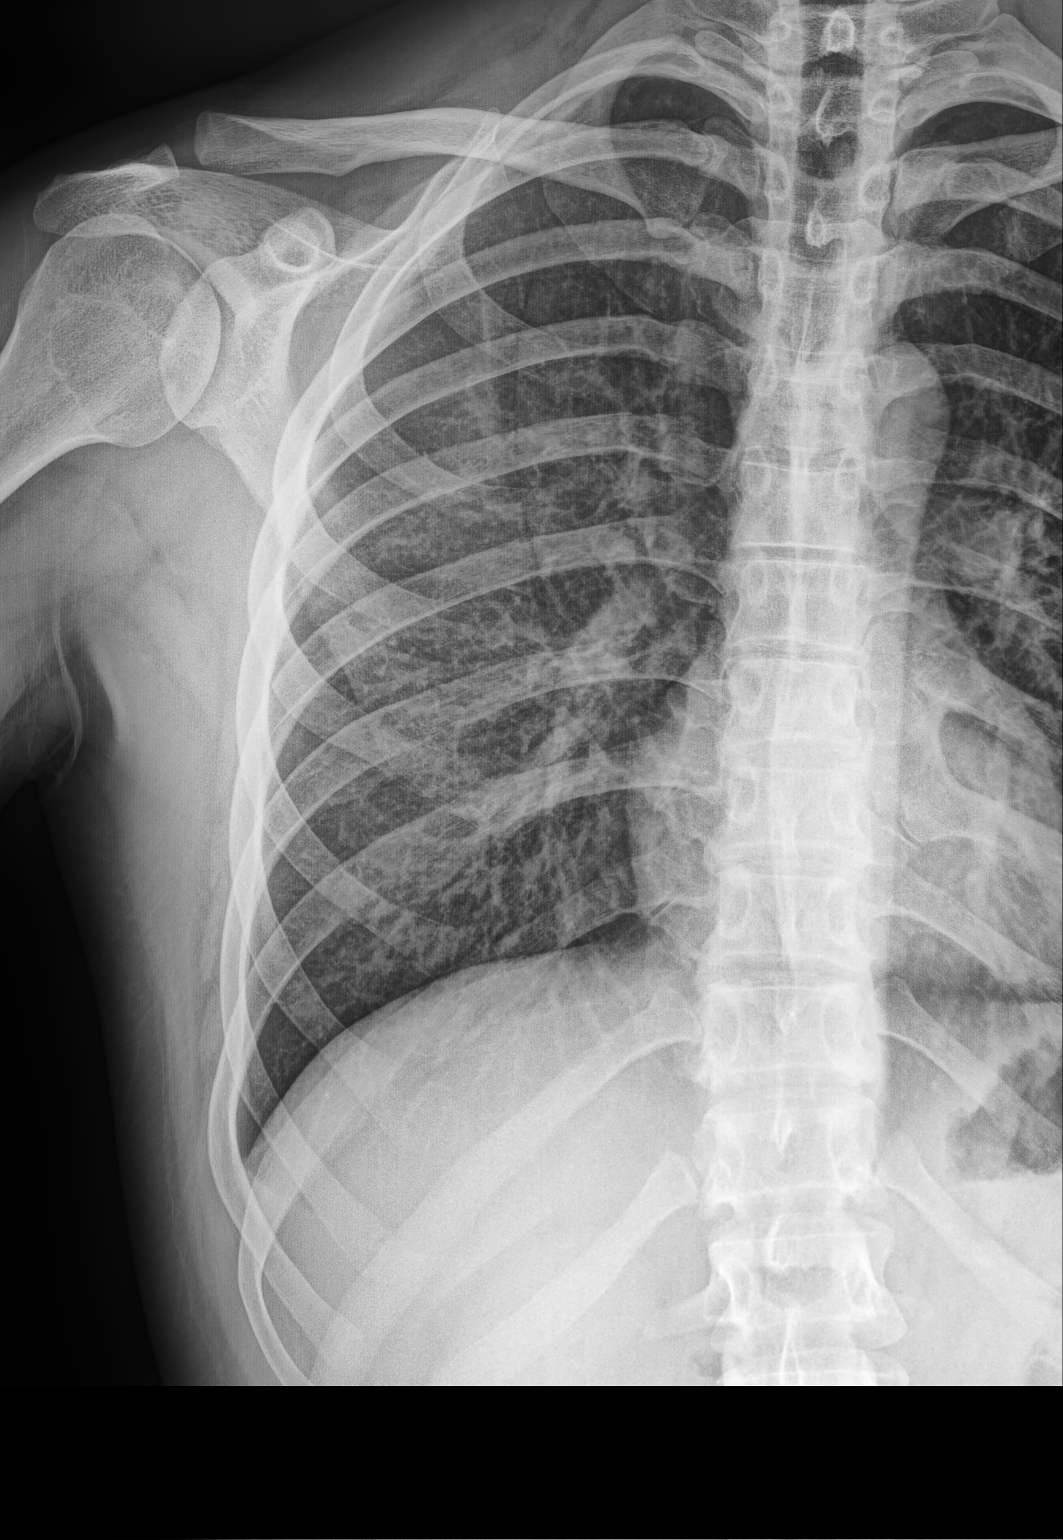

[hemithorax (ribs) mlo]
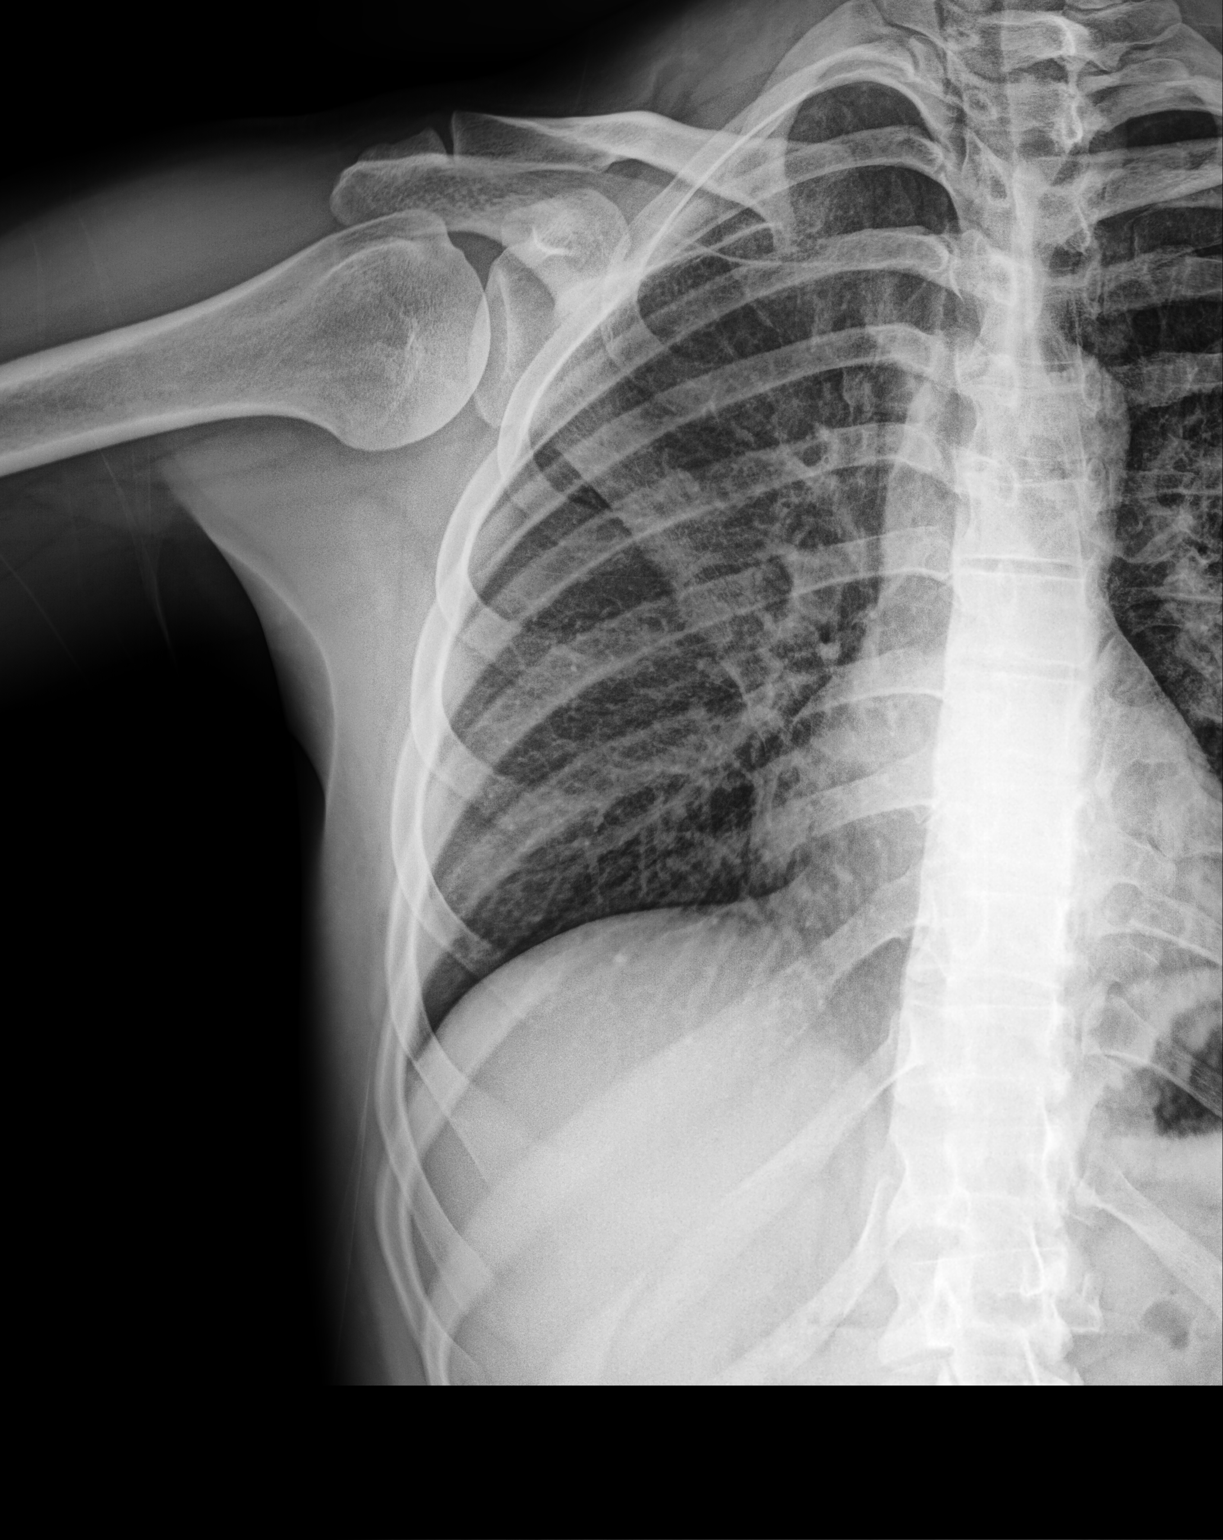

[3 of 3 positions shown; findings below may reference images not displayed]

FINDINGS: The lungs are clear without focal pneumonia, edema, pneumothorax or
pleural effusion. The cardiopericardial silhouette is within normal
limits for size. Oblique views of the right ribs show no evidence
for an acute displaced right-sided rib fracture
IMPRESSION: Negative.
